# Patient Record
Sex: Male | Born: 1965 | Race: White | Hispanic: No | Marital: Married | Smoking: Former smoker
Health system: Southern US, Community
[De-identification: ages and names within clinical notes are randomized; demographics above are authoritative.]

## PROBLEM LIST (undated history)

## (undated) DIAGNOSIS — H3561 Retinal hemorrhage, right eye: Secondary | ICD-10-CM

## (undated) DIAGNOSIS — Z87442 Personal history of urinary calculi: Secondary | ICD-10-CM

## (undated) DIAGNOSIS — I493 Ventricular premature depolarization: Secondary | ICD-10-CM

## (undated) DIAGNOSIS — E785 Hyperlipidemia, unspecified: Secondary | ICD-10-CM

## (undated) DIAGNOSIS — E119 Type 2 diabetes mellitus without complications: Secondary | ICD-10-CM

## (undated) DIAGNOSIS — M479 Spondylosis, unspecified: Secondary | ICD-10-CM

## (undated) DIAGNOSIS — L089 Local infection of the skin and subcutaneous tissue, unspecified: Secondary | ICD-10-CM

## (undated) DIAGNOSIS — I10 Essential (primary) hypertension: Secondary | ICD-10-CM

## (undated) DIAGNOSIS — G4733 Obstructive sleep apnea (adult) (pediatric): Secondary | ICD-10-CM

## (undated) DIAGNOSIS — Z9989 Dependence on other enabling machines and devices: Secondary | ICD-10-CM

## (undated) DIAGNOSIS — G629 Polyneuropathy, unspecified: Secondary | ICD-10-CM

## (undated) HISTORY — DX: Ventricular premature depolarization: I49.3

## (undated) HISTORY — PX: TONSILLECTOMY AND ADENOIDECTOMY: SUR1326

## (undated) HISTORY — DX: Type 2 diabetes mellitus without complications: E11.9

## (undated) HISTORY — DX: Obstructive sleep apnea (adult) (pediatric): G47.33

## (undated) HISTORY — DX: Hyperlipidemia, unspecified: E78.5

## (undated) HISTORY — DX: Essential (primary) hypertension: I10

## (undated) HISTORY — DX: Spondylosis, unspecified: M47.9

## (undated) HISTORY — DX: Local infection of the skin and subcutaneous tissue, unspecified: L08.9

## (undated) HISTORY — DX: Obstructive sleep apnea (adult) (pediatric): Z99.89

## (undated) HISTORY — DX: Polyneuropathy, unspecified: G62.9

## (undated) HISTORY — DX: Retinal hemorrhage, right eye: H35.61

## (undated) HISTORY — PX: OTHER SURGICAL HISTORY: SHX169

---

## 1998-04-01 ENCOUNTER — Ambulatory Visit: Admission: RE | Admit: 1998-04-01 | Discharge: 1998-04-01 | Payer: Self-pay | Admitting: Family Medicine

## 2003-05-02 ENCOUNTER — Ambulatory Visit (HOSPITAL_COMMUNITY): Admission: RE | Admit: 2003-05-02 | Discharge: 2003-05-02 | Payer: Self-pay | Admitting: Family Medicine

## 2003-05-02 ENCOUNTER — Encounter: Payer: Self-pay | Admitting: Family Medicine

## 2003-05-04 ENCOUNTER — Ambulatory Visit (HOSPITAL_COMMUNITY): Admission: RE | Admit: 2003-05-04 | Discharge: 2003-05-04 | Payer: Self-pay | Admitting: Family Medicine

## 2003-05-04 ENCOUNTER — Encounter: Payer: Self-pay | Admitting: Family Medicine

## 2003-08-04 ENCOUNTER — Ambulatory Visit (HOSPITAL_COMMUNITY): Admission: RE | Admit: 2003-08-04 | Discharge: 2003-08-04 | Payer: Self-pay | Admitting: Family Medicine

## 2003-08-04 ENCOUNTER — Encounter: Payer: Self-pay | Admitting: Family Medicine

## 2004-02-15 ENCOUNTER — Ambulatory Visit (HOSPITAL_COMMUNITY): Admission: RE | Admit: 2004-02-15 | Discharge: 2004-02-15 | Payer: Self-pay | Admitting: Family Medicine

## 2004-07-16 ENCOUNTER — Ambulatory Visit: Payer: Self-pay | Admitting: Internal Medicine

## 2004-07-23 ENCOUNTER — Ambulatory Visit: Payer: Self-pay | Admitting: Family Medicine

## 2004-07-23 ENCOUNTER — Ambulatory Visit: Payer: Self-pay | Admitting: *Deleted

## 2004-07-26 ENCOUNTER — Ambulatory Visit: Payer: Self-pay | Admitting: Family Medicine

## 2004-09-25 ENCOUNTER — Ambulatory Visit: Payer: Self-pay | Admitting: Family Medicine

## 2004-10-24 ENCOUNTER — Ambulatory Visit: Payer: Self-pay | Admitting: Family Medicine

## 2004-10-30 ENCOUNTER — Ambulatory Visit: Payer: Self-pay | Admitting: Family Medicine

## 2004-10-31 ENCOUNTER — Ambulatory Visit (HOSPITAL_COMMUNITY): Admission: RE | Admit: 2004-10-31 | Discharge: 2004-10-31 | Payer: Self-pay | Admitting: Family Medicine

## 2004-11-06 ENCOUNTER — Ambulatory Visit: Payer: Self-pay | Admitting: Family Medicine

## 2004-11-20 ENCOUNTER — Ambulatory Visit: Payer: Self-pay | Admitting: Gastroenterology

## 2004-12-03 ENCOUNTER — Ambulatory Visit: Payer: Self-pay | Admitting: Gastroenterology

## 2004-12-03 ENCOUNTER — Ambulatory Visit (HOSPITAL_COMMUNITY): Admission: RE | Admit: 2004-12-03 | Discharge: 2004-12-03 | Payer: Self-pay | Admitting: Gastroenterology

## 2005-01-01 ENCOUNTER — Ambulatory Visit: Payer: Self-pay | Admitting: Family Medicine

## 2005-01-03 ENCOUNTER — Ambulatory Visit (HOSPITAL_COMMUNITY): Admission: RE | Admit: 2005-01-03 | Discharge: 2005-01-03 | Payer: Self-pay | Admitting: Family Medicine

## 2005-02-08 ENCOUNTER — Ambulatory Visit: Payer: Self-pay | Admitting: Family Medicine

## 2005-03-29 ENCOUNTER — Ambulatory Visit: Payer: Self-pay | Admitting: Family Medicine

## 2005-08-09 ENCOUNTER — Ambulatory Visit: Payer: Self-pay | Admitting: Family Medicine

## 2005-08-16 ENCOUNTER — Ambulatory Visit (HOSPITAL_COMMUNITY): Admission: RE | Admit: 2005-08-16 | Discharge: 2005-08-16 | Payer: Self-pay | Admitting: Family Medicine

## 2005-11-12 ENCOUNTER — Ambulatory Visit: Payer: Self-pay | Admitting: Family Medicine

## 2006-01-07 ENCOUNTER — Ambulatory Visit: Payer: Self-pay | Admitting: Family Medicine

## 2006-02-11 ENCOUNTER — Ambulatory Visit: Payer: Self-pay | Admitting: Family Medicine

## 2006-03-05 ENCOUNTER — Ambulatory Visit: Payer: Self-pay | Admitting: Family Medicine

## 2006-03-10 ENCOUNTER — Ambulatory Visit (HOSPITAL_COMMUNITY): Admission: RE | Admit: 2006-03-10 | Discharge: 2006-03-10 | Payer: Self-pay | Admitting: Family Medicine

## 2006-06-04 ENCOUNTER — Ambulatory Visit: Payer: Self-pay | Admitting: Family Medicine

## 2006-07-22 ENCOUNTER — Ambulatory Visit (HOSPITAL_BASED_OUTPATIENT_CLINIC_OR_DEPARTMENT_OTHER): Admission: RE | Admit: 2006-07-22 | Discharge: 2006-07-22 | Payer: Self-pay | Admitting: Family Medicine

## 2006-07-27 ENCOUNTER — Ambulatory Visit: Payer: Self-pay | Admitting: Internal Medicine

## 2006-10-31 ENCOUNTER — Ambulatory Visit: Payer: Self-pay | Admitting: Family Medicine

## 2006-11-26 ENCOUNTER — Ambulatory Visit (HOSPITAL_COMMUNITY): Admission: RE | Admit: 2006-11-26 | Discharge: 2006-11-26 | Payer: Self-pay | Admitting: Family Medicine

## 2006-11-26 ENCOUNTER — Ambulatory Visit: Payer: Self-pay | Admitting: Family Medicine

## 2006-12-22 ENCOUNTER — Ambulatory Visit: Payer: Self-pay | Admitting: Family Medicine

## 2006-12-25 ENCOUNTER — Ambulatory Visit (HOSPITAL_COMMUNITY): Admission: RE | Admit: 2006-12-25 | Discharge: 2006-12-25 | Payer: Self-pay | Admitting: Family Medicine

## 2007-01-05 ENCOUNTER — Ambulatory Visit: Payer: Self-pay | Admitting: Family Medicine

## 2007-01-08 ENCOUNTER — Emergency Department (HOSPITAL_COMMUNITY): Admission: EM | Admit: 2007-01-08 | Discharge: 2007-01-09 | Payer: Self-pay | Admitting: Emergency Medicine

## 2007-02-10 ENCOUNTER — Ambulatory Visit: Payer: Self-pay | Admitting: Family Medicine

## 2007-02-16 ENCOUNTER — Ambulatory Visit (HOSPITAL_COMMUNITY): Admission: RE | Admit: 2007-02-16 | Discharge: 2007-02-16 | Payer: Self-pay | Admitting: Family Medicine

## 2007-04-23 ENCOUNTER — Encounter: Admission: RE | Admit: 2007-04-23 | Discharge: 2007-06-19 | Payer: Self-pay | Admitting: Orthopaedic Surgery

## 2007-05-13 ENCOUNTER — Ambulatory Visit: Payer: Self-pay | Admitting: Family Medicine

## 2007-05-14 ENCOUNTER — Encounter (INDEPENDENT_AMBULATORY_CARE_PROVIDER_SITE_OTHER): Payer: Self-pay | Admitting: Family Medicine

## 2007-05-14 LAB — CONVERTED CEMR LAB: Microalbumin U total vol: 23.8 mg/L

## 2007-05-19 ENCOUNTER — Ambulatory Visit (HOSPITAL_COMMUNITY): Admission: RE | Admit: 2007-05-19 | Discharge: 2007-05-19 | Payer: Self-pay | Admitting: Family Medicine

## 2007-09-08 ENCOUNTER — Ambulatory Visit: Payer: Self-pay | Admitting: Family Medicine

## 2007-09-21 ENCOUNTER — Ambulatory Visit: Payer: Self-pay | Admitting: Family Medicine

## 2007-09-28 ENCOUNTER — Encounter (INDEPENDENT_AMBULATORY_CARE_PROVIDER_SITE_OTHER): Payer: Self-pay | Admitting: Family Medicine

## 2007-09-28 DIAGNOSIS — M545 Low back pain, unspecified: Secondary | ICD-10-CM | POA: Insufficient documentation

## 2007-09-28 DIAGNOSIS — F17200 Nicotine dependence, unspecified, uncomplicated: Secondary | ICD-10-CM | POA: Insufficient documentation

## 2007-09-28 DIAGNOSIS — E785 Hyperlipidemia, unspecified: Secondary | ICD-10-CM | POA: Insufficient documentation

## 2007-09-28 DIAGNOSIS — G4733 Obstructive sleep apnea (adult) (pediatric): Secondary | ICD-10-CM | POA: Insufficient documentation

## 2007-09-28 DIAGNOSIS — M1990 Unspecified osteoarthritis, unspecified site: Secondary | ICD-10-CM | POA: Insufficient documentation

## 2007-09-28 DIAGNOSIS — E1149 Type 2 diabetes mellitus with other diabetic neurological complication: Secondary | ICD-10-CM | POA: Insufficient documentation

## 2007-09-28 DIAGNOSIS — H269 Unspecified cataract: Secondary | ICD-10-CM | POA: Insufficient documentation

## 2007-09-28 DIAGNOSIS — M79609 Pain in unspecified limb: Secondary | ICD-10-CM | POA: Insufficient documentation

## 2007-09-28 DIAGNOSIS — I10 Essential (primary) hypertension: Secondary | ICD-10-CM | POA: Insufficient documentation

## 2007-09-28 DIAGNOSIS — E669 Obesity, unspecified: Secondary | ICD-10-CM | POA: Insufficient documentation

## 2007-09-29 DIAGNOSIS — IMO0001 Reserved for inherently not codable concepts without codable children: Secondary | ICD-10-CM | POA: Insufficient documentation

## 2007-09-29 DIAGNOSIS — E1165 Type 2 diabetes mellitus with hyperglycemia: Secondary | ICD-10-CM

## 2007-10-19 ENCOUNTER — Ambulatory Visit: Payer: Self-pay | Admitting: Internal Medicine

## 2007-10-20 ENCOUNTER — Ambulatory Visit (HOSPITAL_COMMUNITY): Admission: RE | Admit: 2007-10-20 | Discharge: 2007-10-20 | Payer: Self-pay | Admitting: Internal Medicine

## 2007-12-22 ENCOUNTER — Ambulatory Visit: Payer: Self-pay | Admitting: Family Medicine

## 2008-03-08 ENCOUNTER — Ambulatory Visit: Payer: Self-pay | Admitting: Family Medicine

## 2008-03-08 LAB — CONVERTED CEMR LAB
ALT: 56 units/L — ABNORMAL HIGH (ref 0–53)
AST: 41 units/L — ABNORMAL HIGH (ref 0–37)
Albumin: 4.3 g/dL (ref 3.5–5.2)
Alkaline Phosphatase: 101 units/L (ref 39–117)
BUN: 15 mg/dL (ref 6–23)
CO2: 26 meq/L (ref 19–32)
Calcium: 8.9 mg/dL (ref 8.4–10.5)
Chloride: 97 meq/L (ref 96–112)
Cholesterol: 227 mg/dL — ABNORMAL HIGH (ref 0–200)
Creatinine, Ser: 0.71 mg/dL (ref 0.40–1.50)
Glucose, Bld: 324 mg/dL — ABNORMAL HIGH (ref 70–99)
HDL: 36 mg/dL — ABNORMAL LOW (ref >39)
Potassium: 4.5 meq/L (ref 3.5–5.3)
Sodium: 136 meq/L (ref 135–145)
Total Bilirubin: 1.8 mg/dL — ABNORMAL HIGH (ref 0.3–1.2)
Total CHOL/HDL Ratio: 6.3
Total Protein: 7.8 g/dL (ref 6.0–8.3)
Triglycerides: 741 mg/dL — ABNORMAL HIGH (ref <150)

## 2008-04-28 ENCOUNTER — Ambulatory Visit: Payer: Self-pay | Admitting: Family Medicine

## 2008-04-28 LAB — CONVERTED CEMR LAB
ALT: 45 units/L (ref 0–53)
AST: 32 units/L (ref 0–37)
Albumin: 4.4 g/dL (ref 3.5–5.2)
Alkaline Phosphatase: 98 units/L (ref 39–117)
Bilirubin, Direct: 0.2 mg/dL (ref 0.0–0.3)
Cholesterol: 163 mg/dL (ref 0–200)
HDL: 30 mg/dL — ABNORMAL LOW (ref >39)
Indirect Bilirubin: 1.4 mg/dL — ABNORMAL HIGH (ref 0.0–0.9)
Total Bilirubin: 1.6 mg/dL — ABNORMAL HIGH (ref 0.3–1.2)
Total CHOL/HDL Ratio: 5.4
Total Protein: 7.1 g/dL (ref 6.0–8.3)
Triglycerides: 464 mg/dL — ABNORMAL HIGH (ref <150)

## 2008-06-04 IMAGING — CR DG FEET 3 VIEWS BILAT
6 series · 6 of 6 positions shown · non-contrast
Comparison: None

CLINICAL DATA: 40-year-old with left foot pain.  
 LEFT FOOT - 3 VIEW:

[t foot ap left]
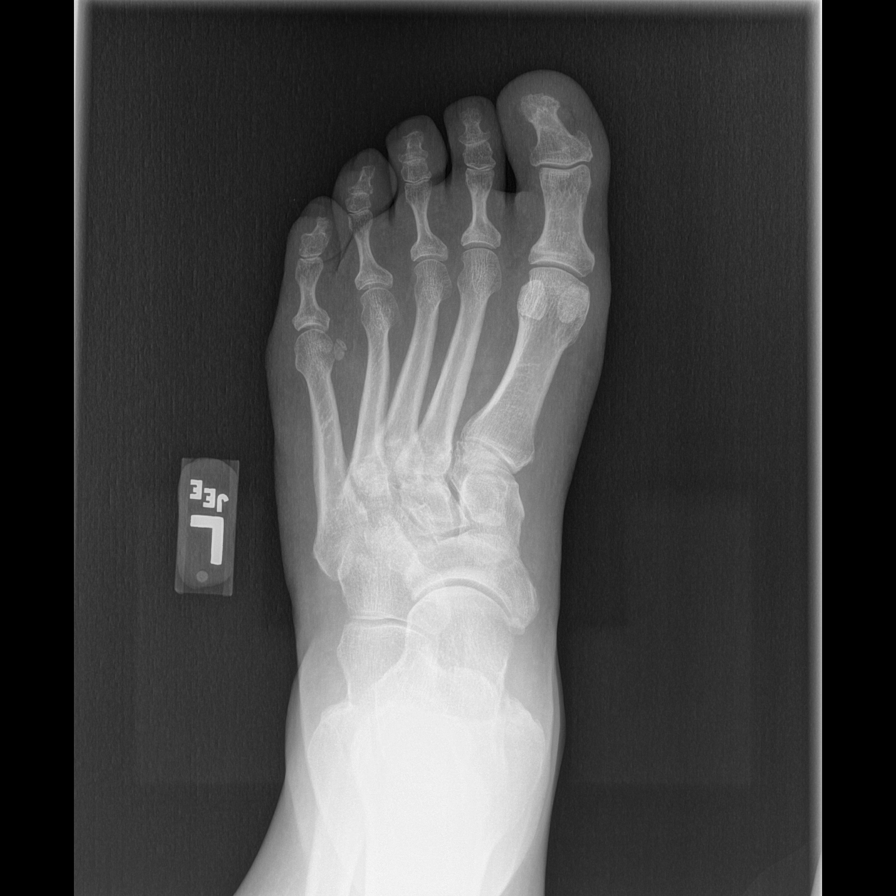

[t foot oblique left *]
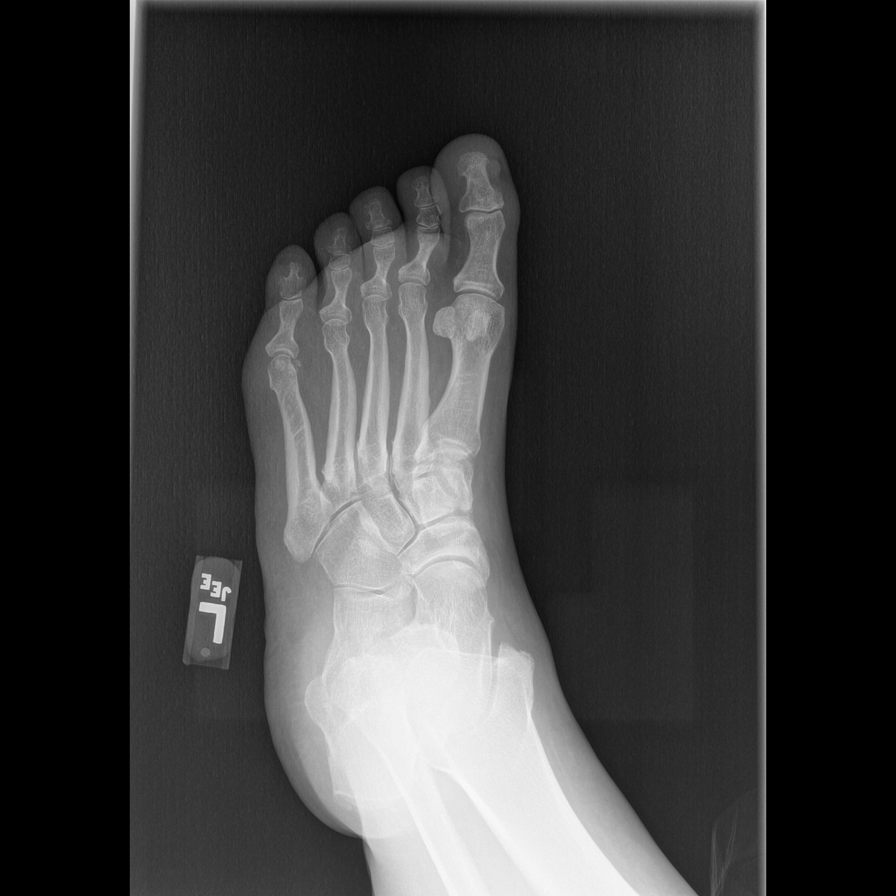

[t foot lat left]
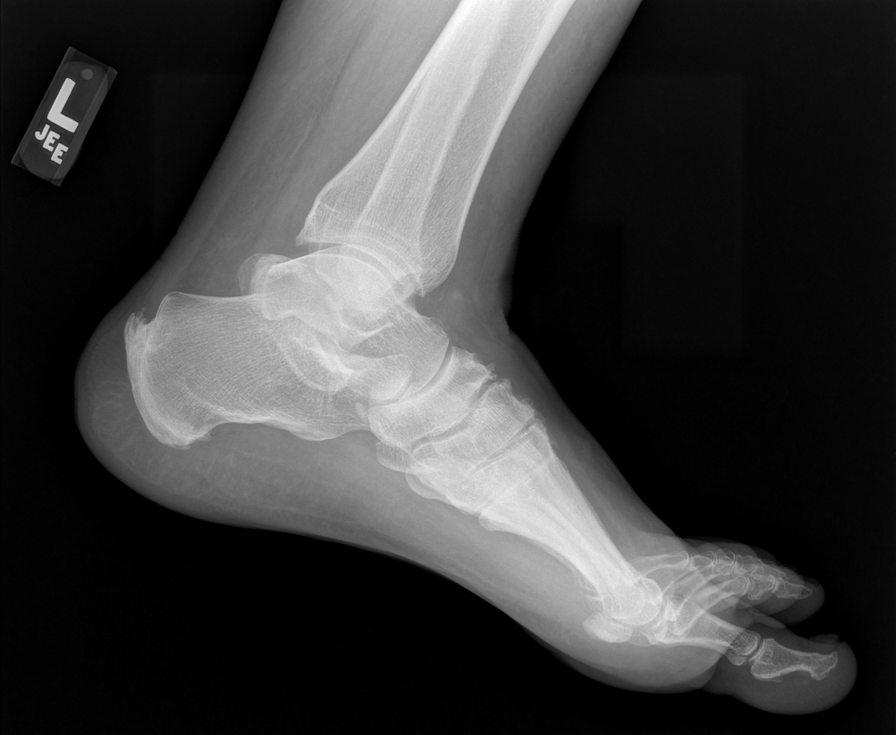

[t foot ap right]
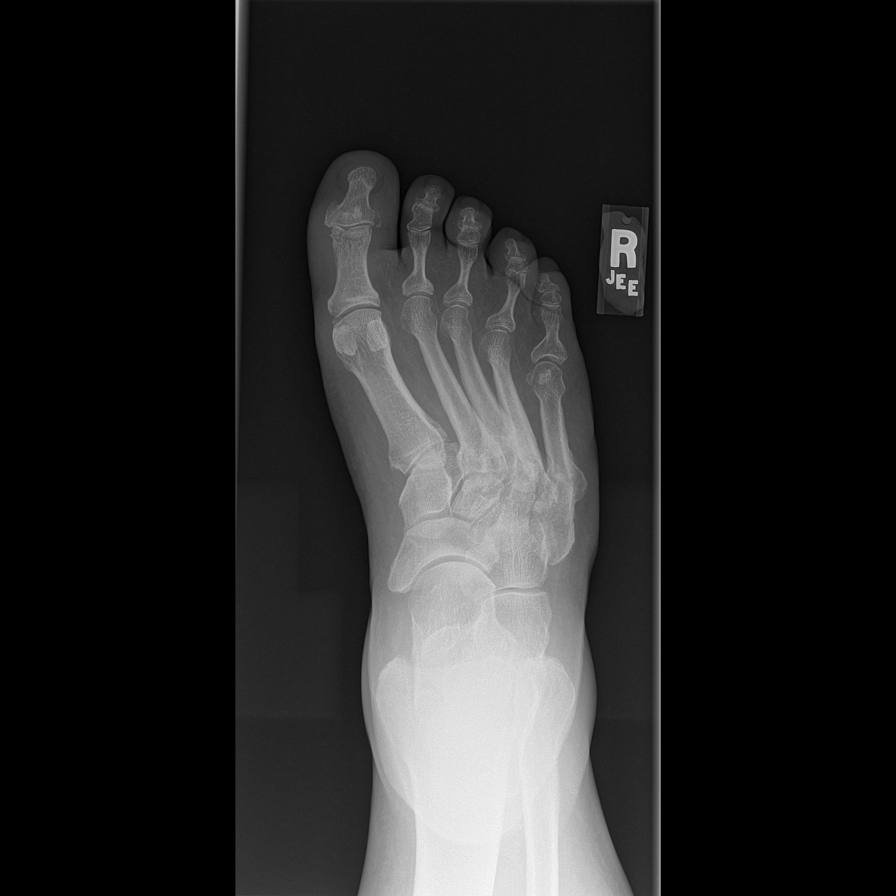

[t foot oblique right]
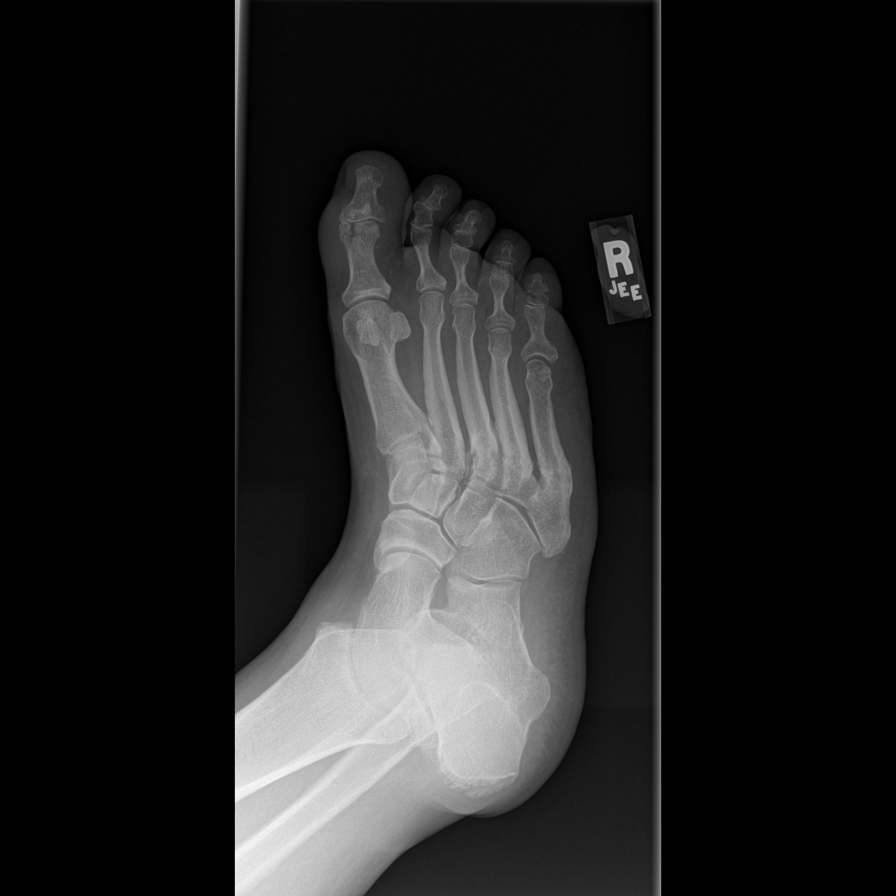

[t foot lat right]
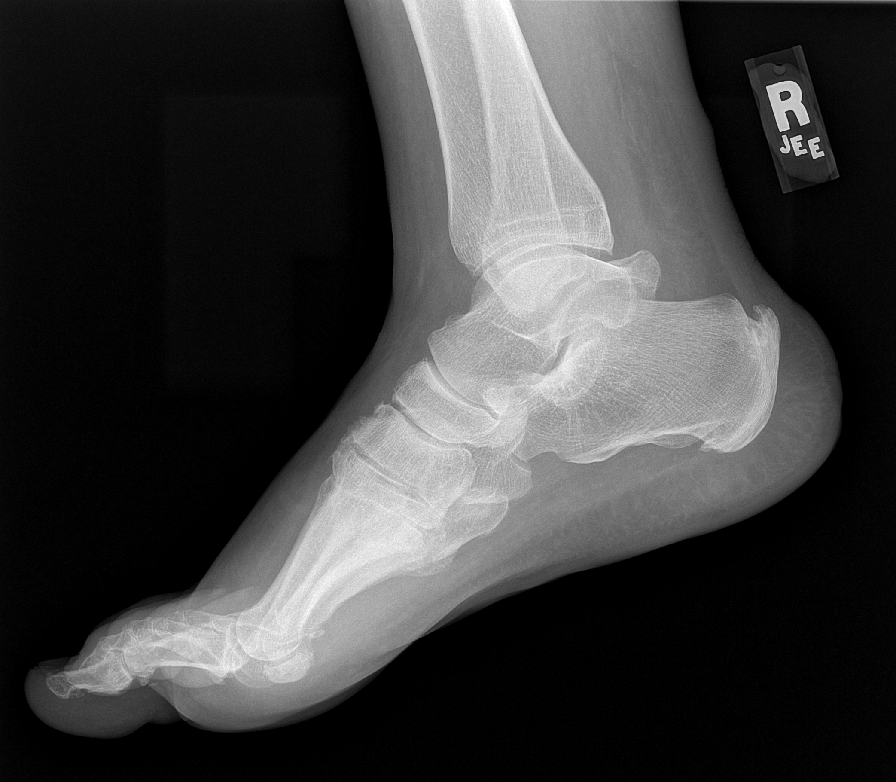

[6 of 6 positions shown; findings below may reference images not displayed]

FINDINGS: Joint spaces are maintained.  Mild midfoot degenerative changes.  No acute bony findings or destructive bony lesions.  Small calcaneal heel spurs are noted.
IMPRESSION: Mid-foot degenerative changes.  No acute bony findings. 
 RIGHT FOOT - 3 VIEW:
FINDINGS: There are midfoot degenerative changes.  There are fractures involving the proximal and distal phalanges of the great toe with an intraarticular component involving the proximal phalanx and slight depression.  Remaining bony structures are intact.  There is a bony exostosis along the lateral aspect of the 5th metatarsal.
IMPRESSION: 1.  Midfoot degenerative changes. 
 2.  Proximal and distal phalanx fractures of the great toe with intraarticular component.  
 3.  Bony exostosis off the lateral margin of the 5th metatarsal.

## 2008-08-08 ENCOUNTER — Ambulatory Visit: Payer: Self-pay | Admitting: Internal Medicine

## 2008-08-10 ENCOUNTER — Encounter: Admission: RE | Admit: 2008-08-10 | Discharge: 2008-08-10 | Payer: Self-pay | Admitting: Orthopaedic Surgery

## 2008-12-19 ENCOUNTER — Ambulatory Visit: Payer: Self-pay | Admitting: Internal Medicine

## 2008-12-19 LAB — CONVERTED CEMR LAB
ALT: 43 units/L (ref 0–53)
Cholesterol: 262 mg/dL — ABNORMAL HIGH (ref 0–200)
HDL: 26 mg/dL — ABNORMAL LOW (ref >39)
Microalb, Ur: 56.25 mg/dL — ABNORMAL HIGH (ref 0.00–1.89)
Total CHOL/HDL Ratio: 10.1
Triglycerides: 1927 mg/dL — ABNORMAL HIGH (ref <150)

## 2009-01-12 ENCOUNTER — Ambulatory Visit: Payer: Self-pay | Admitting: Family Medicine

## 2009-03-13 ENCOUNTER — Ambulatory Visit: Payer: Self-pay | Admitting: Family Medicine

## 2009-03-13 LAB — CONVERTED CEMR LAB
ALT: 36 units/L (ref 0–53)
Cholesterol: 168 mg/dL (ref 0–200)
HDL: 32 mg/dL — ABNORMAL LOW (ref >39)
Total CHOL/HDL Ratio: 5.3
Triglycerides: 487 mg/dL — ABNORMAL HIGH (ref <150)

## 2009-04-14 ENCOUNTER — Ambulatory Visit: Payer: Self-pay | Admitting: Family Medicine

## 2009-04-14 LAB — CONVERTED CEMR LAB: Microalb, Ur: 34.14 mg/dL — ABNORMAL HIGH (ref 0.00–1.89)

## 2009-04-27 ENCOUNTER — Ambulatory Visit: Payer: Self-pay | Admitting: Family Medicine

## 2009-08-31 ENCOUNTER — Emergency Department (HOSPITAL_COMMUNITY): Admission: EM | Admit: 2009-08-31 | Discharge: 2009-08-31 | Payer: Self-pay | Admitting: Emergency Medicine

## 2010-11-24 ENCOUNTER — Encounter: Payer: Self-pay | Admitting: Family Medicine

## 2011-02-14 ENCOUNTER — Other Ambulatory Visit (HOSPITAL_COMMUNITY): Payer: Self-pay | Admitting: Family Medicine

## 2011-02-14 ENCOUNTER — Ambulatory Visit (HOSPITAL_COMMUNITY)
Admission: RE | Admit: 2011-02-14 | Discharge: 2011-02-14 | Disposition: A | Discharge status: Home / Self Care | Payer: Medicaid Other | Source: Ambulatory Visit | Attending: Family Medicine | Admitting: Family Medicine

## 2011-02-14 DIAGNOSIS — M169 Osteoarthritis of hip, unspecified: Secondary | ICD-10-CM | POA: Insufficient documentation

## 2011-02-14 DIAGNOSIS — M1610 Unilateral primary osteoarthritis, unspecified hip: Secondary | ICD-10-CM | POA: Insufficient documentation

## 2011-02-14 DIAGNOSIS — R52 Pain, unspecified: Secondary | ICD-10-CM

## 2011-02-14 DIAGNOSIS — M25559 Pain in unspecified hip: Secondary | ICD-10-CM | POA: Insufficient documentation

## 2011-03-22 NOTE — Procedures (Signed)
NAMEJANET, Joseph Hinton NO.:  [id number]   MEDICAL RECORD NO.:  [id number]          PATIENT TYPE:  OUT   LOCATION:  SLEEP CENTER                 FACILITY:  [facility]   PHYSICIAN:  Clinton D. Maple Hudson, MD, FCCP, FACPDATE OF BIRTH:  1965-01-13   DATE OF STUDY:  07/22/2006                              NOCTURNAL POLYSOMNOGRAM   REFERRING PHYSICIAN:  Dr. Dow Adolph   INDICATIONS FOR STUDY:  Hypersomnia with sleep apnea.   EPWORTH SLEEPINESS SCORE:  17/24.   BMI:  45.7.   HOME MEDICATIONS:  Are listed and reviewed, significant now for a  Gabapentin, amitriptyline, Cymbalta, cyclobenzaprine.   The patient has a history of obstructive sleep apnea with previous  diagnostic study in the 1990s.  He has been using CPAP of 13 CWP at home but  complains of interrupted breathing witnessed struggling in sleep and daytime  tiredness.   SLEEP ARCHITECTURE:  Total sleep time 365 minutes with sleep efficiency 78%.  Stage I was 16%, stage II 84%, stages III and IV and REM were absent.  Sleep  latency 55 minutes, awake after sleep onset 51 minutes, arousal index  markedly increased at 63.9 indicating increased sleep fragmentation  especially in the first half of the night before CPAP.  No bedtime  medication was taken.   RESPIRATORY RESPIRATORY DATA:  Split study protocol.   APNEA/HYPOPNEA INDEX:  (AHI, RDI) 102.7 obstructive events per hour  indicating severe obstructive sleep apnea/hypopnea syndrome before CPAP.  This included 123 obstructive apneas, one central apnea and 96 hypopneas.  Events were not positional.  REM,  AHI n/a.  CPAP was titrated to 18 CWP,  AHI 0 per hour.  His own large ResMed ultra mirage full-face mask was used  with heated humidifier.   OXYGEN DATA:  Moderate to severe snoring before CPAP control with oxygen  desaturation to a nadir of 70%.  After CPAP control oxygen saturation held  94% on room air.   CARDIAC DATA:  Frequent PVCs with ventricular  bigeminy and trigeminy  throughout the study.   MOVEMENTS/PARASOMNIA:  Questionable bruxism.  A total of 118 limb jerks were  recorded of which 48 were associated with arousal or awakening for periodic  limb movement with arousal index of 7.9 per hour which is increased.  This  may partly reflect sleep disturbance while not on his familiar CPAP control  and can be reevaluated clinically after CPAP is adjusted if appropriate.   IMPRESSION/RECOMMENDATIONS:  1. Severe obstructive sleep apnea/hypopnea syndrome, a AHI 102.7 per hour      with non positional events moderate to loud snoring and oxygen      desaturation to 70%.  2. Successful CPAP titration to 18 CWP, a AHI 0 per hour.  His own large      ResMed ultra mirage full-face mask was used with heated humidifier.  3. Periodic limb movement with arousal, 7.9 per hour.  Suggest this be      reconsidered clinically once he is established again at home with CPAP      because these scores may simply reflect the CPAP adjustment during the  study.  4. Frequent PVCs including ventricular bigeminy and ventricular trigeminy.      Clinton D. Maple Hudson, MD, [facility], FACP  Diplomate, Biomedical engineer of Sleep Medicine  Electronically Signed     CDY/MEDQ  D:  07/27/2006 11:16:39  T:  07/29/2006 12:56:45  Job:  [id number]

## 2012-04-06 ENCOUNTER — Other Ambulatory Visit: Payer: Self-pay | Admitting: Family Medicine

## 2012-11-20 ENCOUNTER — Ambulatory Visit: Payer: Self-pay | Admitting: Family Medicine

## 2013-06-06 ENCOUNTER — Other Ambulatory Visit: Payer: Self-pay | Admitting: Physician Assistant

## 2013-06-11 ENCOUNTER — Other Ambulatory Visit: Payer: Self-pay | Admitting: Physician Assistant

## 2013-06-11 NOTE — Telephone Encounter (Signed)
Not seen here in [facility].  Needs OV/Labs for refills. Appears to be a patient of Dr. Angelyn Punt when she was at [facility].

## 2015-08-07 DIAGNOSIS — L97319 Non-pressure chronic ulcer of right ankle with unspecified severity: Secondary | ICD-10-CM | POA: Diagnosis not present

## 2015-08-07 DIAGNOSIS — L97529 Non-pressure chronic ulcer of other part of left foot with unspecified severity: Secondary | ICD-10-CM | POA: Diagnosis not present

## 2015-08-14 DIAGNOSIS — L97319 Non-pressure chronic ulcer of right ankle with unspecified severity: Secondary | ICD-10-CM | POA: Diagnosis not present

## 2015-08-14 DIAGNOSIS — L97529 Non-pressure chronic ulcer of other part of left foot with unspecified severity: Secondary | ICD-10-CM | POA: Diagnosis not present

## 2015-09-11 DIAGNOSIS — I10 Essential (primary) hypertension: Secondary | ICD-10-CM | POA: Diagnosis not present

## 2015-09-11 DIAGNOSIS — E119 Type 2 diabetes mellitus without complications: Secondary | ICD-10-CM | POA: Diagnosis not present

## 2015-09-11 DIAGNOSIS — Z0000 Encounter for general adult medical examination without abnormal findings: Secondary | ICD-10-CM | POA: Diagnosis not present

## 2015-09-11 DIAGNOSIS — R69 Illness, unspecified: Secondary | ICD-10-CM | POA: Diagnosis not present

## 2015-09-19 DIAGNOSIS — E781 Pure hyperglyceridemia: Secondary | ICD-10-CM | POA: Diagnosis not present

## 2015-09-19 DIAGNOSIS — I10 Essential (primary) hypertension: Secondary | ICD-10-CM | POA: Diagnosis not present

## 2015-09-19 DIAGNOSIS — N390 Urinary tract infection, site not specified: Secondary | ICD-10-CM | POA: Diagnosis not present

## 2015-09-19 DIAGNOSIS — Z6841 Body Mass Index (BMI) 40.0 and over, adult: Secondary | ICD-10-CM | POA: Diagnosis not present

## 2015-09-19 DIAGNOSIS — Z87891 Personal history of nicotine dependence: Secondary | ICD-10-CM | POA: Diagnosis not present

## 2015-09-19 DIAGNOSIS — E784 Other hyperlipidemia: Secondary | ICD-10-CM | POA: Diagnosis not present

## 2015-09-19 DIAGNOSIS — G4733 Obstructive sleep apnea (adult) (pediatric): Secondary | ICD-10-CM | POA: Diagnosis not present

## 2015-09-19 DIAGNOSIS — M1990 Unspecified osteoarthritis, unspecified site: Secondary | ICD-10-CM | POA: Diagnosis not present

## 2015-09-19 DIAGNOSIS — E119 Type 2 diabetes mellitus without complications: Secondary | ICD-10-CM | POA: Diagnosis not present

## 2015-09-19 DIAGNOSIS — E669 Obesity, unspecified: Secondary | ICD-10-CM | POA: Diagnosis not present

## 2015-10-11 DIAGNOSIS — R69 Illness, unspecified: Secondary | ICD-10-CM | POA: Diagnosis not present

## 2015-10-24 DIAGNOSIS — R69 Illness, unspecified: Secondary | ICD-10-CM | POA: Diagnosis not present

## 2015-12-09 DIAGNOSIS — R69 Illness, unspecified: Secondary | ICD-10-CM | POA: Diagnosis not present

## 2015-12-19 DIAGNOSIS — R109 Unspecified abdominal pain: Secondary | ICD-10-CM | POA: Diagnosis not present

## 2015-12-19 DIAGNOSIS — Z6841 Body Mass Index (BMI) 40.0 and over, adult: Secondary | ICD-10-CM | POA: Diagnosis not present

## 2015-12-19 DIAGNOSIS — K5900 Constipation, unspecified: Secondary | ICD-10-CM | POA: Diagnosis not present

## 2015-12-19 DIAGNOSIS — J019 Acute sinusitis, unspecified: Secondary | ICD-10-CM | POA: Diagnosis not present

## 2015-12-19 DIAGNOSIS — I10 Essential (primary) hypertension: Secondary | ICD-10-CM | POA: Diagnosis not present

## 2015-12-19 DIAGNOSIS — E119 Type 2 diabetes mellitus without complications: Secondary | ICD-10-CM | POA: Diagnosis not present

## 2016-01-29 DIAGNOSIS — R69 Illness, unspecified: Secondary | ICD-10-CM | POA: Diagnosis not present

## 2016-03-20 DIAGNOSIS — H3582 Retinal ischemia: Secondary | ICD-10-CM | POA: Diagnosis not present

## 2016-03-20 DIAGNOSIS — E113593 Type 2 diabetes mellitus with proliferative diabetic retinopathy without macular edema, bilateral: Secondary | ICD-10-CM | POA: Diagnosis not present

## 2016-03-25 DIAGNOSIS — E781 Pure hyperglyceridemia: Secondary | ICD-10-CM | POA: Diagnosis not present

## 2016-03-25 DIAGNOSIS — E784 Other hyperlipidemia: Secondary | ICD-10-CM | POA: Diagnosis not present

## 2016-03-25 DIAGNOSIS — E119 Type 2 diabetes mellitus without complications: Secondary | ICD-10-CM | POA: Diagnosis not present

## 2016-03-25 DIAGNOSIS — Z6841 Body Mass Index (BMI) 40.0 and over, adult: Secondary | ICD-10-CM | POA: Diagnosis not present

## 2016-03-25 DIAGNOSIS — I10 Essential (primary) hypertension: Secondary | ICD-10-CM | POA: Diagnosis not present

## 2016-03-25 DIAGNOSIS — G4733 Obstructive sleep apnea (adult) (pediatric): Secondary | ICD-10-CM | POA: Diagnosis not present

## 2016-03-25 DIAGNOSIS — M1990 Unspecified osteoarthritis, unspecified site: Secondary | ICD-10-CM | POA: Diagnosis not present

## 2016-03-25 DIAGNOSIS — E1140 Type 2 diabetes mellitus with diabetic neuropathy, unspecified: Secondary | ICD-10-CM | POA: Diagnosis not present

## 2016-03-25 DIAGNOSIS — E11319 Type 2 diabetes mellitus with unspecified diabetic retinopathy without macular edema: Secondary | ICD-10-CM | POA: Diagnosis not present

## 2016-03-25 DIAGNOSIS — E668 Other obesity: Secondary | ICD-10-CM | POA: Diagnosis not present

## 2016-06-07 DIAGNOSIS — Z0100 Encounter for examination of eyes and vision without abnormal findings: Secondary | ICD-10-CM | POA: Diagnosis not present

## 2016-06-07 DIAGNOSIS — H52223 Regular astigmatism, bilateral: Secondary | ICD-10-CM | POA: Diagnosis not present

## 2016-06-12 DIAGNOSIS — H5213 Myopia, bilateral: Secondary | ICD-10-CM | POA: Diagnosis not present

## 2016-06-12 DIAGNOSIS — H52209 Unspecified astigmatism, unspecified eye: Secondary | ICD-10-CM | POA: Diagnosis not present

## 2016-06-12 DIAGNOSIS — H524 Presbyopia: Secondary | ICD-10-CM | POA: Diagnosis not present

## 2016-06-12 DIAGNOSIS — Z0100 Encounter for examination of eyes and vision without abnormal findings: Secondary | ICD-10-CM | POA: Diagnosis not present

## 2016-07-10 DIAGNOSIS — M5136 Other intervertebral disc degeneration, lumbar region: Secondary | ICD-10-CM | POA: Diagnosis not present

## 2016-07-10 DIAGNOSIS — M25552 Pain in left hip: Secondary | ICD-10-CM | POA: Diagnosis not present

## 2016-07-10 DIAGNOSIS — I10 Essential (primary) hypertension: Secondary | ICD-10-CM | POA: Diagnosis not present

## 2016-07-10 DIAGNOSIS — E784 Other hyperlipidemia: Secondary | ICD-10-CM | POA: Diagnosis not present

## 2016-07-10 DIAGNOSIS — Z1389 Encounter for screening for other disorder: Secondary | ICD-10-CM | POA: Diagnosis not present

## 2016-07-10 DIAGNOSIS — Z6841 Body Mass Index (BMI) 40.0 and over, adult: Secondary | ICD-10-CM | POA: Diagnosis not present

## 2016-07-10 DIAGNOSIS — E119 Type 2 diabetes mellitus without complications: Secondary | ICD-10-CM | POA: Diagnosis not present

## 2016-08-06 DIAGNOSIS — L97513 Non-pressure chronic ulcer of other part of right foot with necrosis of muscle: Secondary | ICD-10-CM | POA: Diagnosis not present

## 2016-08-06 DIAGNOSIS — L03119 Cellulitis of unspecified part of limb: Secondary | ICD-10-CM | POA: Diagnosis not present

## 2016-08-13 DIAGNOSIS — L97513 Non-pressure chronic ulcer of other part of right foot with necrosis of muscle: Secondary | ICD-10-CM | POA: Diagnosis not present

## 2016-08-27 DIAGNOSIS — L97512 Non-pressure chronic ulcer of other part of right foot with fat layer exposed: Secondary | ICD-10-CM | POA: Diagnosis not present

## 2016-08-28 DIAGNOSIS — R69 Illness, unspecified: Secondary | ICD-10-CM | POA: Diagnosis not present

## 2016-09-10 DIAGNOSIS — L97512 Non-pressure chronic ulcer of other part of right foot with fat layer exposed: Secondary | ICD-10-CM | POA: Diagnosis not present

## 2016-09-11 DIAGNOSIS — R69 Illness, unspecified: Secondary | ICD-10-CM | POA: Diagnosis not present

## 2016-09-17 ENCOUNTER — Telehealth (INDEPENDENT_AMBULATORY_CARE_PROVIDER_SITE_OTHER): Payer: Self-pay | Admitting: Orthopaedic Surgery

## 2016-09-17 ENCOUNTER — Telehealth (INDEPENDENT_AMBULATORY_CARE_PROVIDER_SITE_OTHER): Payer: Self-pay | Admitting: Radiology

## 2016-09-17 NOTE — Telephone Encounter (Signed)
Please advise 

## 2016-09-17 NOTE — Telephone Encounter (Signed)
All questions answered

## 2016-09-17 NOTE — Telephone Encounter (Signed)
Wife called, has questions about THA surgery, can you call her please?  Wants to know recovery time? Wants to know if there will be rehab after?  In rehab center or will he be sent home?   Wants to know how long in hospital postop? Can he travel [place] hrs back to [place] upon d/c from hospital? How many postop visits will be needed after surgery if everything goes well?   Wants to know also about OOP costs for surgery.

## 2016-09-17 NOTE — Telephone Encounter (Signed)
Patient's wife  Darel Hong(judy) called asked who would Dr Magnus IvanBlackman  recommend (Orthopedic surgeon)  in the [place] area. Darel HongJudy said [place] bigger hospital [place].   The number to contact her is [phone]

## 2016-09-18 DIAGNOSIS — E113592 Type 2 diabetes mellitus with proliferative diabetic retinopathy without macular edema, left eye: Secondary | ICD-10-CM | POA: Diagnosis not present

## 2016-09-18 DIAGNOSIS — H3582 Retinal ischemia: Secondary | ICD-10-CM | POA: Diagnosis not present

## 2016-09-18 DIAGNOSIS — Z125 Encounter for screening for malignant neoplasm of prostate: Secondary | ICD-10-CM | POA: Diagnosis not present

## 2016-09-18 DIAGNOSIS — E119 Type 2 diabetes mellitus without complications: Secondary | ICD-10-CM | POA: Diagnosis not present

## 2016-09-18 DIAGNOSIS — E113511 Type 2 diabetes mellitus with proliferative diabetic retinopathy with macular edema, right eye: Secondary | ICD-10-CM | POA: Diagnosis not present

## 2016-09-18 DIAGNOSIS — H211X2 Other vascular disorders of iris and ciliary body, left eye: Secondary | ICD-10-CM | POA: Diagnosis not present

## 2016-09-18 DIAGNOSIS — E781 Pure hyperglyceridemia: Secondary | ICD-10-CM | POA: Diagnosis not present

## 2016-09-18 NOTE — Telephone Encounter (Signed)
I unfortunately do not know any of the orthopedic surgeons in that area

## 2016-09-19 NOTE — Telephone Encounter (Signed)
He would need to have this healed before surgery.  There is actually a Tax adviserjoint surgeon in [place] who I know named Ledon SnareJack Bowling

## 2016-09-19 NOTE — Telephone Encounter (Signed)
Patient has pressure ulcer on foot, and is asking if this needs to by completely healed before he can have surgery? Also wondering if he can travel several hours home after he is discharged from surgery/hospital.

## 2016-09-20 NOTE — Telephone Encounter (Signed)
Patient aware of the below message  

## 2016-09-23 DIAGNOSIS — Z23 Encounter for immunization: Secondary | ICD-10-CM | POA: Diagnosis not present

## 2016-09-23 DIAGNOSIS — E1140 Type 2 diabetes mellitus with diabetic neuropathy, unspecified: Secondary | ICD-10-CM | POA: Diagnosis not present

## 2016-09-23 DIAGNOSIS — Z0000 Encounter for general adult medical examination without abnormal findings: Secondary | ICD-10-CM | POA: Diagnosis not present

## 2016-09-23 DIAGNOSIS — I10 Essential (primary) hypertension: Secondary | ICD-10-CM | POA: Diagnosis not present

## 2016-09-23 DIAGNOSIS — E668 Other obesity: Secondary | ICD-10-CM | POA: Diagnosis not present

## 2016-09-23 DIAGNOSIS — E119 Type 2 diabetes mellitus without complications: Secondary | ICD-10-CM | POA: Diagnosis not present

## 2016-09-23 DIAGNOSIS — Z6841 Body Mass Index (BMI) 40.0 and over, adult: Secondary | ICD-10-CM | POA: Diagnosis not present

## 2016-09-23 DIAGNOSIS — E784 Other hyperlipidemia: Secondary | ICD-10-CM | POA: Diagnosis not present

## 2016-09-23 DIAGNOSIS — G4733 Obstructive sleep apnea (adult) (pediatric): Secondary | ICD-10-CM | POA: Diagnosis not present

## 2016-09-23 DIAGNOSIS — E11621 Type 2 diabetes mellitus with foot ulcer: Secondary | ICD-10-CM | POA: Diagnosis not present

## 2016-10-08 DIAGNOSIS — L97512 Non-pressure chronic ulcer of other part of right foot with fat layer exposed: Secondary | ICD-10-CM | POA: Diagnosis not present

## 2016-11-05 DIAGNOSIS — L97512 Non-pressure chronic ulcer of other part of right foot with fat layer exposed: Secondary | ICD-10-CM | POA: Diagnosis not present

## 2016-11-05 DIAGNOSIS — E119 Type 2 diabetes mellitus without complications: Secondary | ICD-10-CM | POA: Diagnosis not present

## 2016-11-05 DIAGNOSIS — Z01812 Encounter for preprocedural laboratory examination: Secondary | ICD-10-CM | POA: Diagnosis not present

## 2016-11-06 DIAGNOSIS — E113511 Type 2 diabetes mellitus with proliferative diabetic retinopathy with macular edema, right eye: Secondary | ICD-10-CM | POA: Diagnosis not present

## 2016-11-07 DIAGNOSIS — R69 Illness, unspecified: Secondary | ICD-10-CM | POA: Diagnosis not present

## 2016-11-13 DIAGNOSIS — G4733 Obstructive sleep apnea (adult) (pediatric): Secondary | ICD-10-CM | POA: Diagnosis not present

## 2016-11-21 DIAGNOSIS — L97513 Non-pressure chronic ulcer of other part of right foot with necrosis of muscle: Secondary | ICD-10-CM | POA: Diagnosis not present

## 2016-11-21 DIAGNOSIS — L97512 Non-pressure chronic ulcer of other part of right foot with fat layer exposed: Secondary | ICD-10-CM | POA: Diagnosis not present

## 2016-11-26 DIAGNOSIS — L97512 Non-pressure chronic ulcer of other part of right foot with fat layer exposed: Secondary | ICD-10-CM | POA: Diagnosis not present

## 2016-12-10 DIAGNOSIS — M12271 Villonodular synovitis (pigmented), right ankle and foot: Secondary | ICD-10-CM | POA: Diagnosis not present

## 2016-12-17 DIAGNOSIS — L97512 Non-pressure chronic ulcer of other part of right foot with fat layer exposed: Secondary | ICD-10-CM | POA: Diagnosis not present

## 2016-12-30 DIAGNOSIS — E119 Type 2 diabetes mellitus without complications: Secondary | ICD-10-CM | POA: Diagnosis not present

## 2016-12-30 DIAGNOSIS — E784 Other hyperlipidemia: Secondary | ICD-10-CM | POA: Diagnosis not present

## 2016-12-30 DIAGNOSIS — I10 Essential (primary) hypertension: Secondary | ICD-10-CM | POA: Diagnosis not present

## 2016-12-30 DIAGNOSIS — N433 Hydrocele, unspecified: Secondary | ICD-10-CM | POA: Diagnosis not present

## 2016-12-30 DIAGNOSIS — E668 Other obesity: Secondary | ICD-10-CM | POA: Diagnosis not present

## 2016-12-30 DIAGNOSIS — N509 Disorder of male genital organs, unspecified: Secondary | ICD-10-CM | POA: Diagnosis not present

## 2016-12-30 DIAGNOSIS — R200 Anesthesia of skin: Secondary | ICD-10-CM | POA: Diagnosis not present

## 2017-01-28 DIAGNOSIS — L97511 Non-pressure chronic ulcer of other part of right foot limited to breakdown of skin: Secondary | ICD-10-CM | POA: Diagnosis not present

## 2017-02-24 DIAGNOSIS — H3582 Retinal ischemia: Secondary | ICD-10-CM | POA: Diagnosis not present

## 2017-02-24 DIAGNOSIS — H33103 Unspecified retinoschisis, bilateral: Secondary | ICD-10-CM | POA: Diagnosis not present

## 2017-02-24 DIAGNOSIS — E113593 Type 2 diabetes mellitus with proliferative diabetic retinopathy without macular edema, bilateral: Secondary | ICD-10-CM | POA: Diagnosis not present

## 2017-02-24 DIAGNOSIS — N430 Encysted hydrocele: Secondary | ICD-10-CM | POA: Diagnosis not present

## 2017-04-30 DIAGNOSIS — M795 Residual foreign body in soft tissue: Secondary | ICD-10-CM | POA: Diagnosis not present

## 2017-04-30 DIAGNOSIS — R5383 Other fatigue: Secondary | ICD-10-CM | POA: Diagnosis not present

## 2017-04-30 DIAGNOSIS — I10 Essential (primary) hypertension: Secondary | ICD-10-CM | POA: Diagnosis not present

## 2017-04-30 DIAGNOSIS — E11319 Type 2 diabetes mellitus with unspecified diabetic retinopathy without macular edema: Secondary | ICD-10-CM | POA: Diagnosis not present

## 2017-04-30 DIAGNOSIS — E784 Other hyperlipidemia: Secondary | ICD-10-CM | POA: Diagnosis not present

## 2017-04-30 DIAGNOSIS — Z6841 Body Mass Index (BMI) 40.0 and over, adult: Secondary | ICD-10-CM | POA: Diagnosis not present

## 2017-04-30 DIAGNOSIS — G4733 Obstructive sleep apnea (adult) (pediatric): Secondary | ICD-10-CM | POA: Diagnosis not present

## 2017-05-28 DIAGNOSIS — M25775 Osteophyte, left foot: Secondary | ICD-10-CM | POA: Diagnosis not present

## 2017-05-28 DIAGNOSIS — L97522 Non-pressure chronic ulcer of other part of left foot with fat layer exposed: Secondary | ICD-10-CM | POA: Diagnosis not present

## 2017-05-28 DIAGNOSIS — L97529 Non-pressure chronic ulcer of other part of left foot with unspecified severity: Secondary | ICD-10-CM | POA: Diagnosis not present

## 2017-06-04 DIAGNOSIS — R600 Localized edema: Secondary | ICD-10-CM | POA: Diagnosis not present

## 2017-06-04 DIAGNOSIS — L97529 Non-pressure chronic ulcer of other part of left foot with unspecified severity: Secondary | ICD-10-CM | POA: Diagnosis not present

## 2017-06-04 DIAGNOSIS — M86172 Other acute osteomyelitis, left ankle and foot: Secondary | ICD-10-CM | POA: Diagnosis not present

## 2017-06-13 DIAGNOSIS — T8189XA Other complications of procedures, not elsewhere classified, initial encounter: Secondary | ICD-10-CM | POA: Diagnosis not present

## 2017-06-13 DIAGNOSIS — M948X7 Other specified disorders of cartilage, ankle and foot: Secondary | ICD-10-CM | POA: Diagnosis not present

## 2017-06-13 DIAGNOSIS — H52223 Regular astigmatism, bilateral: Secondary | ICD-10-CM | POA: Diagnosis not present

## 2017-06-13 DIAGNOSIS — H524 Presbyopia: Secondary | ICD-10-CM | POA: Diagnosis not present

## 2017-06-13 DIAGNOSIS — M898X7 Other specified disorders of bone, ankle and foot: Secondary | ICD-10-CM | POA: Diagnosis not present

## 2017-06-13 DIAGNOSIS — M86172 Other acute osteomyelitis, left ankle and foot: Secondary | ICD-10-CM | POA: Diagnosis not present

## 2017-06-13 DIAGNOSIS — L97522 Non-pressure chronic ulcer of other part of left foot with fat layer exposed: Secondary | ICD-10-CM | POA: Diagnosis not present

## 2017-06-25 DIAGNOSIS — M25572 Pain in left ankle and joints of left foot: Secondary | ICD-10-CM | POA: Diagnosis not present

## 2017-06-25 DIAGNOSIS — L97522 Non-pressure chronic ulcer of other part of left foot with fat layer exposed: Secondary | ICD-10-CM | POA: Diagnosis not present

## 2017-06-30 DIAGNOSIS — H5213 Myopia, bilateral: Secondary | ICD-10-CM | POA: Diagnosis not present

## 2017-06-30 DIAGNOSIS — H524 Presbyopia: Secondary | ICD-10-CM | POA: Diagnosis not present

## 2017-06-30 DIAGNOSIS — H52209 Unspecified astigmatism, unspecified eye: Secondary | ICD-10-CM | POA: Diagnosis not present

## 2017-07-02 ENCOUNTER — Encounter: Payer: Self-pay | Admitting: Neurology

## 2017-07-02 ENCOUNTER — Ambulatory Visit (INDEPENDENT_AMBULATORY_CARE_PROVIDER_SITE_OTHER): Payer: Medicare HMO | Admitting: Neurology

## 2017-07-02 ENCOUNTER — Encounter (INDEPENDENT_AMBULATORY_CARE_PROVIDER_SITE_OTHER): Payer: Self-pay

## 2017-07-02 VITALS — BP 136/69 | HR 77 | Ht 71.0 in | Wt 315.0 lb

## 2017-07-02 DIAGNOSIS — G4733 Obstructive sleep apnea (adult) (pediatric): Secondary | ICD-10-CM | POA: Diagnosis not present

## 2017-07-02 DIAGNOSIS — Z9989 Dependence on other enabling machines and devices: Secondary | ICD-10-CM

## 2017-07-02 DIAGNOSIS — E113593 Type 2 diabetes mellitus with proliferative diabetic retinopathy without macular edema, bilateral: Secondary | ICD-10-CM | POA: Diagnosis not present

## 2017-07-02 DIAGNOSIS — H33193 Other retinoschisis and retinal cysts, bilateral: Secondary | ICD-10-CM | POA: Diagnosis not present

## 2017-07-02 DIAGNOSIS — H3582 Retinal ischemia: Secondary | ICD-10-CM | POA: Diagnosis not present

## 2017-07-02 NOTE — Patient Instructions (Addendum)
You are fully compliant with CPAP. It takes care of your sleep apnea quite well at this time. I would not recommend pursuing a sleep study quite yet as you are still dealing with the foot infection and are not able to sleep very well. We would have to wait things out just a little bit. In addition, you have left hip pain and may be facing left hip replacement surgery and I would recommend that you are stable in that regard as well before we resort to bringing her back for another sleep study.  Please try to limit your time in bed. We recommend 7-8 hours of sleep for the average adult.  I think you're daytime tiredness is likely from a combination of things including having recurrent infections and taking high doses of antibiotics long-term for this. In addition, you have trouble with your hip and suffer from neuropathy. This can cause sleep disruption from discomfort and pain. Taking gabapentin at this dose can also cause daytime tiredness and side effects including sleepiness during the day.  I would recommend that we reevaluate things including your symptoms and the compliance data in about 6 months from now and we can consider bringing you back for a sleep study at the time. You do not have to bring your machine at the time so long as you can bring the compliance data card from your machine at the visit.

## 2017-07-02 NOTE — Progress Notes (Signed)
Subjective:    Patient ID: Joseph Hinton is a 51 y.o. male.  HPI      Joseph Foley, MD, PhD [facility] [address] [address] [place], [place] [address]  Dear Dr. Link Snuffer,   I saw your patient, Joseph Hinton, upon your kind request in my neurologic clinic today for initial consultation of his sleep disorder, in particular, evaluation of his obstructive sleep apnea. The patient is accompanied by his wife today. As you know, Joseph Hinton is a 51 year old right-handed gentleman with an underlying medical history of type 2 diabetes, with associated neuropathy and history of retinal hemorrhage with status post laser surgery, history of PVCs, hypertension, hyperlipidemia, osteoarthritis and recurrent foot infections and left foot osteomyelitis, and morbid obesity with a BMI of over 40, who was previously diagnosed with obstructive sleep apnea and placed on CPAP therapy. Sleep study testing was many years ago and his CPAP machine is several years old, likely from 2011, per patient report. I was able to review his compliance data from the card and his machine. This was from 06/03/2017 through 07/02/2017, last 30 days, during which time he used his machine 29 days with percent used days greater than 4 hours at 93%, indicating excellent compliance with a high average usage of 11 hours and 22 minutes, which is quite high. Leak is in the acceptable range with the 95th percentile at 19.8 L/m, pressure at 18 cm. Prior sleep study results are not available for my review today. I reviewed your office note from 04/30/2017, which you kindly included.  He is status post sleep apnea surgery including tonsillectomy, UPPP. He quit smoking in 2000. He lives with his wife. He is on disability.  His original Dx for OSA, he recalls, was in 1999, pressure, as he recalls was originally 13 cm, then at some point 15 cm and most recently 18 cm. He recalls being told, he had severe OSA. His weight  has fluctuated. He is currently wearing a boot from his foot surgery and recurrent foot infection. He also had surgery in the past to his right foot. For his diabetic neuropathy he is on high-dose gabapentin, 600 mg 3 times a day. His main complaint is not being rested during the day and feeling tired all the time. He's not frankly sleepy. Epworth Sleepiness Scale score is 0 out of 24 today, fatigue score is 63 out of 63. He does not have night to night nocturia. He is not aware of any family history of obstructive sleep apnea. In order to elevate his legs he spends a lot of time in bed. He is not indicating any issues with his current CPAP machine. We called his DME company and are advised that he has had this machine since November 2011. [facility] also does not have the most recent results of his sleep study. He does not have any issues with the CPAP pressure or the mask and indicates full compliance with treatment. He quit smoking in 2001. He does not currently drink any alcohol. He drinks caffeine in the form of coffee, 2-3 cups per day. He is on disability. He lives with his wife, they have 1 child.   His Past Medical History Is Significant For: Past Medical History:  Diagnosis Date  . Diabetes mellitus without complication (HCC)   . Foot infection   . Hyperlipidemia   . Hypertension   . Neuropathy   . OA (osteoarthritis of spine)   . OSA on CPAP   .  PVC's (premature ventricular contractions)   . Retinal hemorrhage, right     His Past Surgical History Is Significant For: Past Surgical History:  Procedure Laterality Date  . TONSILLECTOMY AND ADENOIDECTOMY    . uvulaectomy      His Family History Is Significant For: Family History  Problem Relation Age of Onset  . Stroke Mother   . Diabetes Mother   . Hypertension Mother   . Nephrolithiasis Brother   . Lung cancer Father     His Social History Is Significant For: Social History   Social History  . Marital status:  Married    Spouse name: N/A  . Number of children: N/A  . Years of education: N/A   Social History Main Topics  . Smoking status: Former Games developermoker  . Smokeless tobacco: Never Used  . Alcohol use No     Comment: quit in 1997  . Drug use: No  . Sexual activity: Not Asked   Other Topics Concern  . None   Social History Narrative  . None    His Allergies Are:  No Known Allergies:   His Current Medications Are:  Outpatient Encounter Prescriptions as of 07/02/2017  Medication Sig  . ACCU-CHEK AVIVA PLUS test strip USE AS DIRECTED TO CHECK BLOOD SUGARS TWICE DAILY  . atorvastatin (LIPITOR) 10 MG tablet Take 10 mg by mouth daily.  . cephALEXin (KEFLEX) 500 MG capsule Take 1,000 mg by mouth 2 (two) times daily.  . enalapril (VASOTEC) 10 MG tablet Take 10 mg by mouth daily.  Marland Kitchen. gabapentin (NEURONTIN) 600 MG tablet Take 600 mg by mouth 3 (three) times daily.  Marland Kitchen. glimepiride (AMARYL) 4 MG tablet Take 0.5 tablets by mouth 2 (two) times daily.  . metFORMIN (GLUCOPHAGE) 1000 MG tablet Take 1,000 mg by mouth 2 (two) times daily.  . rifampin (RIFADIN) 150 MG capsule Take 1 capsule by mouth daily.   No facility-administered encounter medications on file as of 07/02/2017.   :  Review of Systems:  Out of a complete 14 point review of systems, all are reviewed and negative with the exception of these symptoms as listed below: Review of Systems  Neurological:       Pt presents today to discuss his cpap. Pt has not had a sleep study in a long time. Pt uses his cpap every night but feels tired during the day. Pt uses [facility] as his DME.  Epworth Sleepiness Scale 0= would never doze 1= slight chance of dozing 2= moderate chance of dozing 3= high chance of dozing  Sitting and reading: 0 Watching TV: 0 Sitting inactive in a public place (ex. Theater or meeting): 0 As a passenger in a car for an hour without a break: 0 Lying down to rest in the afternoon: 0 Sitting and talking to someone: 0 Sitting  quietly after lunch (no alcohol): 0 In a car, while stopped in traffic: 0 Total: 0     Objective:  Neurological Exam  Physical Exam Physical Examination:   Vitals:   07/02/17 1331  BP: 136/69  Pulse: 77    General Examination: The patient is a very pleasant 51 y.o. male in no acute distress. He appears well-developed and well-nourished and adequately groomed.   HEENT: Normocephalic, atraumatic, pupils are equal, round and reactive to light and accommodation. Extraocular tracking is good without limitation to gaze excursion or nystagmus noted. Normal smooth pursuit is noted. Hearing is grossly intact. Face is symmetric with normal facial animation and normal facial sensation.  Speech is clear with no dysarthria noted. There is no hypophonia. There is no lip, neck/head, jaw or voice tremor. Neck is supple with full range of passive and active motion. There are no carotid bruits on auscultation. Oropharynx exam reveals: moderate mouth dryness, adequate dental hygiene and s/p UPPP. Tongue protrudes centrally and palate elevates symmetrically. Tonsils absent. Neck size is larger.   Chest: Clear to auscultation without wheezing, rhonchi or crackles noted.  Heart: S1+S2+0, regular and normal without murmurs, rubs or gallops noted.   Abdomen: Soft, non-tender and non-distended with normal bowel sounds appreciated on auscultation.  Extremities: There is 2+ pitting edema in the distal lower extremities bilaterally.   Skin: Chronic appearing hyperpigmentation and discoloration in the distal lower extremities bilaterally.  Musculoskeletal: exam reveals left foot and improved. Left hip pain.    Neurologically:  Mental status: The patient is awake, alert and oriented in all 4 spheres. His immediate and remote memory, attention, language skills and fund of knowledge are appropriate. There is no evidence of aphasia, agnosia, apraxia or anomia. Speech is clear with normal prosody and enunciation.  Thought process is linear. Mood is normal and affect is normal.  Cranial nerves II - XII are as described above under HEENT exam. In addition: shoulder shrug is normal with equal shoulder height noted. Motor exam: Normal bulk, strength and tone is noted. There is no drift, tremor or rebound. Romberg is negative. Reflexes are 1+ in the upper extremities, absent in the lower extremities. Reduced sensation noted in the distal lower extremities. Fine motor skills are difficult to assess in the lower extremities, adequate in the upper extremities. No intention tremor or dysmetria, no resting tremor. Heel-to-shin is not possible.  Gait, station and balance: He stands With difficulty and has a limp.   Assessment and Plan:  In summary, Andi Mahaffy is a very pleasant 51 y.o.-year old male with an underlying medical history of type 2 diabetes, with associated neuropathy and history of retinal hemorrhage with status post laser surgery, history of PVCs, hypertension, hyperlipidemia, osteoarthritis and recurrent foot infections and left foot osteomyelitis, and morbid obesity with a BMI of over 40, who presents for evaluation of his prior diagnosis of OSA. He has been on CPAP therapy for years. He is currently on a CPAP at a pressure of 18 cm with full compliance and good results as far as apnea control. Nevertheless, he reports tiredness and fatigue and lack of energy, which I believe is due to a combination of factors including recent surgery, recurrent osteomyelitis and having to take longer term antibiotics and also taking gabapentin for neuropathy which can cause daytime tiredness and sleepiness. I cautioned the patient to not spend too much time in bed. He is limited also with respect to his mobility secondary to his left hip pain and maybe facing left hip replacement as soon as possible as I understand. As of right now, I do not suggest that we proceed with repeat sleep study testing because he is adequately treated  with his current CPAP settings and his machine seems to be working and he is compliant. He is wearing of blood on the left foot and is not sleeping very well, disturbed by pain and discomfort in his foot and hip. This would not be a good time to do sleep study testing. I encouraged him to wait things out a little longer and suggested a six-month recheck, at which time we can review his symptoms again and also reviewed his compliance. He has  been receiving his supplies from [facility]. I answered all their questions today and the patient and his wife were in agreement.   Thank you very much for allowing me to participate in the care of this nice patient. If I can be of any further assistance to you please do not hesitate to call me at [phone].  Sincerely,   Joseph Foley, MD, PhD

## 2017-07-10 DIAGNOSIS — L97522 Non-pressure chronic ulcer of other part of left foot with fat layer exposed: Secondary | ICD-10-CM | POA: Diagnosis not present

## 2017-07-14 DIAGNOSIS — G4733 Obstructive sleep apnea (adult) (pediatric): Secondary | ICD-10-CM | POA: Diagnosis not present

## 2017-07-24 DIAGNOSIS — L97522 Non-pressure chronic ulcer of other part of left foot with fat layer exposed: Secondary | ICD-10-CM | POA: Diagnosis not present

## 2017-07-24 DIAGNOSIS — M86172 Other acute osteomyelitis, left ankle and foot: Secondary | ICD-10-CM | POA: Diagnosis not present

## 2017-07-29 DIAGNOSIS — Z136 Encounter for screening for cardiovascular disorders: Secondary | ICD-10-CM | POA: Diagnosis not present

## 2017-07-29 DIAGNOSIS — Z6841 Body Mass Index (BMI) 40.0 and over, adult: Secondary | ICD-10-CM | POA: Diagnosis not present

## 2017-07-29 DIAGNOSIS — L309 Dermatitis, unspecified: Secondary | ICD-10-CM | POA: Diagnosis not present

## 2017-08-13 DIAGNOSIS — G4733 Obstructive sleep apnea (adult) (pediatric): Secondary | ICD-10-CM | POA: Diagnosis not present

## 2017-08-14 DIAGNOSIS — L97522 Non-pressure chronic ulcer of other part of left foot with fat layer exposed: Secondary | ICD-10-CM | POA: Diagnosis not present

## 2017-08-28 DIAGNOSIS — L97521 Non-pressure chronic ulcer of other part of left foot limited to breakdown of skin: Secondary | ICD-10-CM | POA: Diagnosis not present

## 2017-08-28 DIAGNOSIS — L600 Ingrowing nail: Secondary | ICD-10-CM | POA: Diagnosis not present

## 2017-09-10 DIAGNOSIS — L97521 Non-pressure chronic ulcer of other part of left foot limited to breakdown of skin: Secondary | ICD-10-CM | POA: Diagnosis not present

## 2017-09-10 DIAGNOSIS — T8189XA Other complications of procedures, not elsewhere classified, initial encounter: Secondary | ICD-10-CM | POA: Diagnosis not present

## 2017-09-13 DIAGNOSIS — G4733 Obstructive sleep apnea (adult) (pediatric): Secondary | ICD-10-CM | POA: Diagnosis not present

## 2017-10-02 DIAGNOSIS — I10 Essential (primary) hypertension: Secondary | ICD-10-CM | POA: Diagnosis not present

## 2017-10-02 DIAGNOSIS — M1611 Unilateral primary osteoarthritis, right hip: Secondary | ICD-10-CM | POA: Diagnosis not present

## 2017-10-02 DIAGNOSIS — R82998 Other abnormal findings in urine: Secondary | ICD-10-CM | POA: Diagnosis not present

## 2017-10-02 DIAGNOSIS — E291 Testicular hypofunction: Secondary | ICD-10-CM | POA: Diagnosis not present

## 2017-10-02 DIAGNOSIS — Z0000 Encounter for general adult medical examination without abnormal findings: Secondary | ICD-10-CM | POA: Diagnosis not present

## 2017-10-02 DIAGNOSIS — E7849 Other hyperlipidemia: Secondary | ICD-10-CM | POA: Diagnosis not present

## 2017-10-02 DIAGNOSIS — N5089 Other specified disorders of the male genital organs: Secondary | ICD-10-CM | POA: Diagnosis not present

## 2017-10-02 DIAGNOSIS — Z125 Encounter for screening for malignant neoplasm of prostate: Secondary | ICD-10-CM | POA: Diagnosis not present

## 2017-10-02 DIAGNOSIS — G4733 Obstructive sleep apnea (adult) (pediatric): Secondary | ICD-10-CM | POA: Diagnosis not present

## 2017-10-02 DIAGNOSIS — E1121 Type 2 diabetes mellitus with diabetic nephropathy: Secondary | ICD-10-CM | POA: Diagnosis not present

## 2017-10-02 DIAGNOSIS — M1612 Unilateral primary osteoarthritis, left hip: Secondary | ICD-10-CM | POA: Diagnosis not present

## 2017-10-02 DIAGNOSIS — L97521 Non-pressure chronic ulcer of other part of left foot limited to breakdown of skin: Secondary | ICD-10-CM | POA: Diagnosis not present

## 2017-10-02 DIAGNOSIS — E11621 Type 2 diabetes mellitus with foot ulcer: Secondary | ICD-10-CM | POA: Diagnosis not present

## 2017-10-02 DIAGNOSIS — E1140 Type 2 diabetes mellitus with diabetic neuropathy, unspecified: Secondary | ICD-10-CM | POA: Diagnosis not present

## 2017-10-13 DIAGNOSIS — G4733 Obstructive sleep apnea (adult) (pediatric): Secondary | ICD-10-CM | POA: Diagnosis not present

## 2017-10-13 DIAGNOSIS — Z1212 Encounter for screening for malignant neoplasm of rectum: Secondary | ICD-10-CM | POA: Diagnosis not present

## 2017-10-13 DIAGNOSIS — Z1211 Encounter for screening for malignant neoplasm of colon: Secondary | ICD-10-CM | POA: Diagnosis not present

## 2017-10-17 DIAGNOSIS — Z1212 Encounter for screening for malignant neoplasm of rectum: Secondary | ICD-10-CM | POA: Diagnosis not present

## 2017-10-31 DIAGNOSIS — G4733 Obstructive sleep apnea (adult) (pediatric): Secondary | ICD-10-CM | POA: Diagnosis not present

## 2017-12-31 ENCOUNTER — Ambulatory Visit: Payer: Medicare HMO | Admitting: Neurology

## 2018-12-10 DIAGNOSIS — G4733 Obstructive sleep apnea (adult) (pediatric): Secondary | ICD-10-CM | POA: Diagnosis not present

## 2018-12-16 DIAGNOSIS — Z1212 Encounter for screening for malignant neoplasm of rectum: Secondary | ICD-10-CM | POA: Diagnosis not present

## 2019-01-06 DIAGNOSIS — H52223 Regular astigmatism, bilateral: Secondary | ICD-10-CM | POA: Diagnosis not present

## 2019-01-06 DIAGNOSIS — E669 Obesity, unspecified: Secondary | ICD-10-CM | POA: Diagnosis not present

## 2019-01-06 DIAGNOSIS — H524 Presbyopia: Secondary | ICD-10-CM | POA: Diagnosis not present

## 2019-01-06 DIAGNOSIS — E1169 Type 2 diabetes mellitus with other specified complication: Secondary | ICD-10-CM | POA: Diagnosis not present

## 2019-01-06 DIAGNOSIS — I10 Essential (primary) hypertension: Secondary | ICD-10-CM | POA: Diagnosis not present

## 2019-02-18 DIAGNOSIS — G4733 Obstructive sleep apnea (adult) (pediatric): Secondary | ICD-10-CM | POA: Diagnosis not present

## 2019-03-09 DIAGNOSIS — I10 Essential (primary) hypertension: Secondary | ICD-10-CM | POA: Diagnosis not present

## 2019-03-09 DIAGNOSIS — R0781 Pleurodynia: Secondary | ICD-10-CM | POA: Diagnosis not present

## 2019-03-09 DIAGNOSIS — E1169 Type 2 diabetes mellitus with other specified complication: Secondary | ICD-10-CM | POA: Diagnosis not present

## 2019-04-13 DIAGNOSIS — E11319 Type 2 diabetes mellitus with unspecified diabetic retinopathy without macular edema: Secondary | ICD-10-CM | POA: Diagnosis not present

## 2019-04-13 DIAGNOSIS — H33193 Other retinoschisis and retinal cysts, bilateral: Secondary | ICD-10-CM | POA: Diagnosis not present

## 2019-04-13 DIAGNOSIS — H2513 Age-related nuclear cataract, bilateral: Secondary | ICD-10-CM | POA: Diagnosis not present

## 2019-04-13 DIAGNOSIS — I10 Essential (primary) hypertension: Secondary | ICD-10-CM | POA: Diagnosis not present

## 2019-04-13 DIAGNOSIS — E669 Obesity, unspecified: Secondary | ICD-10-CM | POA: Diagnosis not present

## 2019-04-13 DIAGNOSIS — E113593 Type 2 diabetes mellitus with proliferative diabetic retinopathy without macular edema, bilateral: Secondary | ICD-10-CM | POA: Diagnosis not present

## 2019-04-13 DIAGNOSIS — E1169 Type 2 diabetes mellitus with other specified complication: Secondary | ICD-10-CM | POA: Diagnosis not present

## 2019-04-13 DIAGNOSIS — H3582 Retinal ischemia: Secondary | ICD-10-CM | POA: Diagnosis not present

## 2019-04-13 DIAGNOSIS — E1140 Type 2 diabetes mellitus with diabetic neuropathy, unspecified: Secondary | ICD-10-CM | POA: Diagnosis not present

## 2019-04-13 DIAGNOSIS — G4733 Obstructive sleep apnea (adult) (pediatric): Secondary | ICD-10-CM | POA: Diagnosis not present

## 2019-04-13 DIAGNOSIS — E7849 Other hyperlipidemia: Secondary | ICD-10-CM | POA: Diagnosis not present

## 2019-06-03 DIAGNOSIS — M542 Cervicalgia: Secondary | ICD-10-CM | POA: Diagnosis not present

## 2019-06-03 DIAGNOSIS — M9903 Segmental and somatic dysfunction of lumbar region: Secondary | ICD-10-CM | POA: Diagnosis not present

## 2019-06-03 DIAGNOSIS — M5116 Intervertebral disc disorders with radiculopathy, lumbar region: Secondary | ICD-10-CM | POA: Diagnosis not present

## 2019-06-03 DIAGNOSIS — M546 Pain in thoracic spine: Secondary | ICD-10-CM | POA: Diagnosis not present

## 2019-06-07 DIAGNOSIS — M9903 Segmental and somatic dysfunction of lumbar region: Secondary | ICD-10-CM | POA: Diagnosis not present

## 2019-06-07 DIAGNOSIS — M5116 Intervertebral disc disorders with radiculopathy, lumbar region: Secondary | ICD-10-CM | POA: Diagnosis not present

## 2019-06-07 DIAGNOSIS — M546 Pain in thoracic spine: Secondary | ICD-10-CM | POA: Diagnosis not present

## 2019-06-07 DIAGNOSIS — M542 Cervicalgia: Secondary | ICD-10-CM | POA: Diagnosis not present

## 2019-06-10 DIAGNOSIS — M5116 Intervertebral disc disorders with radiculopathy, lumbar region: Secondary | ICD-10-CM | POA: Diagnosis not present

## 2019-06-10 DIAGNOSIS — M542 Cervicalgia: Secondary | ICD-10-CM | POA: Diagnosis not present

## 2019-06-10 DIAGNOSIS — M9903 Segmental and somatic dysfunction of lumbar region: Secondary | ICD-10-CM | POA: Diagnosis not present

## 2019-06-10 DIAGNOSIS — M546 Pain in thoracic spine: Secondary | ICD-10-CM | POA: Diagnosis not present

## 2019-06-17 DIAGNOSIS — M542 Cervicalgia: Secondary | ICD-10-CM | POA: Diagnosis not present

## 2019-06-17 DIAGNOSIS — M546 Pain in thoracic spine: Secondary | ICD-10-CM | POA: Diagnosis not present

## 2019-06-17 DIAGNOSIS — M9903 Segmental and somatic dysfunction of lumbar region: Secondary | ICD-10-CM | POA: Diagnosis not present

## 2019-06-17 DIAGNOSIS — M5116 Intervertebral disc disorders with radiculopathy, lumbar region: Secondary | ICD-10-CM | POA: Diagnosis not present

## 2019-06-24 DIAGNOSIS — M546 Pain in thoracic spine: Secondary | ICD-10-CM | POA: Diagnosis not present

## 2019-06-24 DIAGNOSIS — M9903 Segmental and somatic dysfunction of lumbar region: Secondary | ICD-10-CM | POA: Diagnosis not present

## 2019-06-24 DIAGNOSIS — M5116 Intervertebral disc disorders with radiculopathy, lumbar region: Secondary | ICD-10-CM | POA: Diagnosis not present

## 2019-06-24 DIAGNOSIS — M542 Cervicalgia: Secondary | ICD-10-CM | POA: Diagnosis not present

## 2019-07-01 DIAGNOSIS — M542 Cervicalgia: Secondary | ICD-10-CM | POA: Diagnosis not present

## 2019-07-01 DIAGNOSIS — M5116 Intervertebral disc disorders with radiculopathy, lumbar region: Secondary | ICD-10-CM | POA: Diagnosis not present

## 2019-07-01 DIAGNOSIS — M546 Pain in thoracic spine: Secondary | ICD-10-CM | POA: Diagnosis not present

## 2019-07-01 DIAGNOSIS — M9903 Segmental and somatic dysfunction of lumbar region: Secondary | ICD-10-CM | POA: Diagnosis not present

## 2019-07-14 ENCOUNTER — Other Ambulatory Visit: Payer: Self-pay

## 2019-07-14 ENCOUNTER — Ambulatory Visit (INDEPENDENT_AMBULATORY_CARE_PROVIDER_SITE_OTHER): Payer: Medicare HMO

## 2019-07-14 ENCOUNTER — Encounter: Payer: Self-pay | Admitting: Orthopaedic Surgery

## 2019-07-14 ENCOUNTER — Ambulatory Visit (INDEPENDENT_AMBULATORY_CARE_PROVIDER_SITE_OTHER): Payer: Medicare HMO | Admitting: Orthopaedic Surgery

## 2019-07-14 VITALS — Ht 71.0 in | Wt 288.0 lb

## 2019-07-14 DIAGNOSIS — E669 Obesity, unspecified: Secondary | ICD-10-CM | POA: Diagnosis not present

## 2019-07-14 DIAGNOSIS — W19XXXA Unspecified fall, initial encounter: Secondary | ICD-10-CM | POA: Diagnosis not present

## 2019-07-14 DIAGNOSIS — M1612 Unilateral primary osteoarthritis, left hip: Secondary | ICD-10-CM | POA: Diagnosis not present

## 2019-07-14 DIAGNOSIS — M25552 Pain in left hip: Secondary | ICD-10-CM

## 2019-07-14 DIAGNOSIS — S3992XA Unspecified injury of lower back, initial encounter: Secondary | ICD-10-CM | POA: Diagnosis not present

## 2019-07-14 DIAGNOSIS — I10 Essential (primary) hypertension: Secondary | ICD-10-CM | POA: Diagnosis not present

## 2019-07-14 DIAGNOSIS — E1169 Type 2 diabetes mellitus with other specified complication: Secondary | ICD-10-CM | POA: Diagnosis not present

## 2019-07-14 NOTE — Progress Notes (Signed)
Office Visit Note   Patient: Joseph SheererJames Hillmer           Date of Birth: [birth year]           MRN: [id number] Visit Date: 07/14/2019              Requested by: Alysia PennaHolwerda, Scott, MD [address] [place],  [place] [id number] PCP: Alysia PennaHolwerda, Scott, MD   Assessment & Plan: Visit Diagnoses:  1. Pain in left hip   2. Unilateral primary osteoarthritis, left hip     Plan: At this point I am comfortable proceeding with a total hip arthroplasty on the left side.  He is lost 61 pounds and his blood glucose is under excellent control.  We will work on setting him up for surgery for hopefully October 23.  I would like to see him back in 4 weeks for repeat weight to see that he still trending in the right direction but I see no reason to not proceed with hip replacement surgery at this point.  I spent some time in the office showing him his x-rays.  We went over hip replacement model explained in detail what the surgery involves.  We talked about the risk and benefits.  We talked about the preoperative and postoperative course and intraoperative course.  All question concerns were answered addressed.  Again we will see him back in 4 weeks with no x-rays are needed I would just like to wait and we can talk to him about the details of surgery at that standpoint but we will go ahead and schedule this as well because I am comfortable with proceeding with surgery I do feel that is medically necessary at this point.  Follow-Up Instructions: Return in about 4 weeks (around 08/11/2019).   Orders:  Orders Placed This Encounter  Procedures  . XR HIP UNILAT W OR W/O PELVIS 1V LEFT   No orders of the defined types were placed in this encounter.     Procedures: No procedures performed   Clinical Data: No additional findings.   Subjective: No chief complaint on file. The patient is someone well-known to me.  I have actually seen him in the past but is been over 3 years.  This is for debilitating arthritis  involving his left hip.  At one point he was a poorly controlled diabetic and he did have surgeries on both feet at one point.  [place].  He still has debilitating arthritis and pain involving his left hip.  He now has good diabetic control with a hemoglobin A1c of 6.2 this morning.  His weight is down from a high of 249 pounds now to just 288 pounds.  His BMI is only 40.17.  This is down significantly.  His hip pain is become debilitating.  He walks with a significant Trendelenburg gait.  It is detriment affect his mobility, his quality of life and his activities daily living.  He does take anti-inflammatories.  He is worked on weight loss and activity modification.  His pain is 10-10 and he is in need of hip replacement surgery at this standpoint.  HPI  Review of Systems He currently denies any headache, chest pain, shortness of breath, fever, chills, vomiting, nausea  Objective: Vital Signs: Ht 5\' 11"  (1.803 m)   Wt 288 lb (130.6 kg)   BMI 40.17 kg/m   Physical Exam He is alert and orient x3 in no acute distress Ortho Exam He does walk with  a significant Trendelenburg gait.  His left hip has essentially most no rotation to it at all and his pain is severe when I attempt to rotate his left hip.  His right hip exam is normal.  There is a slight leg length discrepancy with his left side being shorter than the right.  Laying him supine on the exam table I can easily get to his left hip. Specialty Comments:  No specialty comments available.  Imaging: Xr Hip Unilat W Or W/o Pelvis 1v Left  Result Date: 07/14/2019 An AP pelvis and lateral left hip shows complete loss of the left hip joint.  There is essentially no space remaining.  There is particular osteophytes and sclerotic changes as well as cystic changes.  This is severe end-stage arthritis.    PMFS History: Patient Active Problem List   Diagnosis Date Noted  . Unilateral primary osteoarthritis, left  hip 07/14/2019  . DIABETES MELLITUS, TYPE II, UNCONTROLLED 09/29/2007  . DIABETIC PERIPHERAL NEUROPATHY 09/28/2007  . HYPERLIPIDEMIA 09/28/2007  . OBESITY 09/28/2007  . NICOTINE ADDICTION 09/28/2007  . OBSTRUCTIVE SLEEP APNEA 09/28/2007  . CATARACTS 09/28/2007  . HYPERTENSION 09/28/2007  . OSTEOARTHRITIS 09/28/2007  . LOW BACK PAIN 09/28/2007  . FOOT PAIN, LEFT 09/28/2007   Past Medical History:  Diagnosis Date  . Diabetes mellitus without complication ([place])   . Foot infection   . Hyperlipidemia   . Hypertension   . Neuropathy   . OA (osteoarthritis of spine)   . OSA on CPAP   . PVC's (premature ventricular contractions)   . Retinal hemorrhage, right     Family History  Problem Relation Age of Onset  . Stroke Mother   . Diabetes Mother   . Hypertension Mother   . Nephrolithiasis Brother   . Lung cancer Father     Past Surgical History:  Procedure Laterality Date  . TONSILLECTOMY AND ADENOIDECTOMY    . uvulaectomy     Social History   Occupational History  . Not on file  Tobacco Use  . Smoking status: Former Research scientist (life sciences)  . Smokeless tobacco: Never Used  Substance and Sexual Activity  . Alcohol use: No    Comment: quit in 1997  . Drug use: No  . Sexual activity: Not on file

## 2019-07-15 DIAGNOSIS — M9903 Segmental and somatic dysfunction of lumbar region: Secondary | ICD-10-CM | POA: Diagnosis not present

## 2019-07-15 DIAGNOSIS — M5116 Intervertebral disc disorders with radiculopathy, lumbar region: Secondary | ICD-10-CM | POA: Diagnosis not present

## 2019-07-15 DIAGNOSIS — M542 Cervicalgia: Secondary | ICD-10-CM | POA: Diagnosis not present

## 2019-07-15 DIAGNOSIS — M546 Pain in thoracic spine: Secondary | ICD-10-CM | POA: Diagnosis not present

## 2019-08-11 ENCOUNTER — Ambulatory Visit (INDEPENDENT_AMBULATORY_CARE_PROVIDER_SITE_OTHER): Payer: Medicare HMO | Admitting: Orthopaedic Surgery

## 2019-08-11 ENCOUNTER — Other Ambulatory Visit: Payer: Self-pay

## 2019-08-11 ENCOUNTER — Encounter: Payer: Self-pay | Admitting: Orthopaedic Surgery

## 2019-08-11 VITALS — Wt 286.0 lb

## 2019-08-11 DIAGNOSIS — E6609 Other obesity due to excess calories: Secondary | ICD-10-CM | POA: Diagnosis not present

## 2019-08-11 DIAGNOSIS — M1612 Unilateral primary osteoarthritis, left hip: Secondary | ICD-10-CM | POA: Diagnosis not present

## 2019-08-11 DIAGNOSIS — Z6839 Body mass index (BMI) 39.0-39.9, adult: Secondary | ICD-10-CM

## 2019-08-11 DIAGNOSIS — M25552 Pain in left hip: Secondary | ICD-10-CM | POA: Diagnosis not present

## 2019-08-11 NOTE — Progress Notes (Signed)
The patient is well-known to me.  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  His pain is daily and it is detriment affecting his mobility, his quality of life and his activities daily living.  He is a diabetic but now has good blood glucose control.  He has been morbidly obese before but is now worked on significant weight loss.  He is now down to a BMI of 39.8.  He is lost almost 70 pounds at this standpoint.  I am concerned about his fall risk given the severity of his arthritis.  On exam his left hip is incredibly stiff but his right hip moves normally.  We did go over his x-rays again from his last visit showed him the severity of his arthritis.  I feel comfortable at this point setting him up for a total hip arthroplasty.  He will continue to lose weight prior to surgery.  I spent a significant time in the office today showing a hip model.  We went over his x-rays.  We described what hip replacement surgery involves.  We talked in detail about the risk and benefits of surgery as well as interoperative and postoperative course.  Given the failure of conservative treatment for over a year and given the severity of his arthritis with almost no joint space remaining at this point with significant large para-articular osteophytes as well as sclerotic and cystic changes, a left total hip arthroplasty is definitely warranted.  All question concerns were answered and addressed.  We will work on getting him scheduled for the surgery which I feel is medically necessary at this standpoint.

## 2019-08-13 DIAGNOSIS — G4733 Obstructive sleep apnea (adult) (pediatric): Secondary | ICD-10-CM | POA: Diagnosis not present

## 2019-08-26 DIAGNOSIS — M546 Pain in thoracic spine: Secondary | ICD-10-CM | POA: Diagnosis not present

## 2019-08-26 DIAGNOSIS — M9903 Segmental and somatic dysfunction of lumbar region: Secondary | ICD-10-CM | POA: Diagnosis not present

## 2019-08-26 DIAGNOSIS — M5116 Intervertebral disc disorders with radiculopathy, lumbar region: Secondary | ICD-10-CM | POA: Diagnosis not present

## 2019-08-26 DIAGNOSIS — M542 Cervicalgia: Secondary | ICD-10-CM | POA: Diagnosis not present

## 2019-09-01 ENCOUNTER — Other Ambulatory Visit: Payer: Self-pay

## 2019-09-02 ENCOUNTER — Other Ambulatory Visit: Payer: Self-pay | Admitting: Physician Assistant

## 2019-09-06 NOTE — Patient Instructions (Addendum)
DUE TO COVID-19 ONLY ONE VISITOR IS ALLOWED TO COME WITH YOU AND STAY IN THE WAITING ROOM ONLY DURING PRE OP AND PROCEDURE DAY OF SURGERY. THE 1 VISITOR MAY VISIT WITH YOU AFTER SURGERY IN YOUR PRIVATE ROOM DURING VISITING HOURS ONLY!  YOU NEED TO HAVE A COVID 19 TEST ON__11/3_____ @_______ , THIS TEST MUST BE DONE BEFORE SURGERY, COME  [place], [address] [place] , [address].  ([place])   ONCE YOUR COVID TEST IS COMPLETED, PLEASE BEGIN THE QUARANTINE INSTRUCTIONS AS OUTLINED IN YOUR HANDOUT.                Joseph Hinton   Your procedure is scheduled on: 09/10/19   Report to [facility] Main  Entrance Report to admitting at  8:30 AM     Call this number if you have problems the morning of surgery [phone]   . BRUSH YOUR TEETH MORNING OF SURGERY AND RINSE YOUR MOUTH OUT, NO CHEWING GUM CANDY OR MINTS.   Do not eat food After Midnight.   YOU MAY HAVE CLEAR LIQUIDS FROM MIDNIGHT UNTIL 8:00 AM.   CLEAR LIQUID DIET   Foods Allowed                                                                     Foods Excluded  Coffee and tea, regular and decaf                             liquids that you cannot  Plain Jell-O any favor except red or purple                                           see through such as: Fruit ices (not with fruit pulp)                                     milk, soups, orange juice  Iced Popsicles                                    All solid food Carbonated beverages, regular and diet                                    Cranberry, grape and apple juices Sports drinks like Gatorade Lightly seasoned clear broth or consume(fat free) Sugar, honey syrup   At 8:00 AM Please finish the prescribed Pre-Surgery Gatorade drink.   Nothing by mouth after you finish the Gatorade drink !     Take these medicines the morning of surgery with A SIP OF WATER: none  Bring mask and tubing to the hospital with you  DO NOT TAKE ANY DIABETIC MEDICATIONS  DAY OF YOUR SURGERY          How to Manage Your Diabetes Before and After Surgery  Why is it important to control my  blood sugar before and after surgery? . Improving blood sugar levels before and after surgery helps healing and can limit problems. . A way of improving blood sugar control is eating a healthy diet by: o  Eating less sugar and carbohydrates o  Increasing activity/exercise o  Talking with your doctor about reaching your blood sugar goals . High blood sugars (greater than 180 mg/dL) can raise your risk of infections and slow your recovery, so you will need to focus on controlling your diabetes during the weeks before surgery. . Make sure that the doctor who takes care of your diabetes knows about your planned surgery including the date and location.  How do I manage my blood sugar before surgery? . Check your blood sugar at least 4 times a day, starting 2 days before surgery, to make sure that the level is not too high or low. o Check your blood sugar the morning of your surgery when you wake up and every 2 hours until you get to the Short Stay unit. . If your blood sugar is less than 70 mg/dL, you will need to treat for low blood sugar: o Do not take insulin. o Treat a low blood sugar (less than 70 mg/dL) with  cup of clear juice (cranberry or apple), 4 glucose tablets, OR glucose gel. o Recheck blood sugar in 15 minutes after treatment (to make sure it is greater than 70 mg/dL). If your blood sugar is not greater than 70 mg/dL on recheck, call [phone] for further instructions. . Report your blood sugar to the short stay nurse when you get to Short Stay.  . If you are admitted to the hospital after surgery: o Your blood sugar will be checked by the staff and you will probably be given insulin after surgery (instead of oral diabetes medicines) to make sure you have good blood sugar levels. o The goal for blood sugar control after surgery is 80-180 mg/dL.   WHAT DO I DO  ABOUT MY DIABETES MEDICATION?  Marland Kitchen Do not take oral diabetes medicines (pills) the morning of surgery.  . The day of surgery, do not take other diabetes injectables, including Byetta (exenatide), Bydureon (exenatide ER), Victoza (liraglutide), or Trulicity (dulaglutide).Ozempic(semaglutide).               You may not have any metal on your body including piercings               Do not wear jewelry, lotions, powders or  deodorant              Men may shave face and neck.   Do not bring valuables to the hospital. [place] IS NOT             RESPONSIBLE   FOR VALUABLES.  Contacts, dentures or bridgework may not be worn into surgery.     Name and phone number of your driver:  Special Instructions: N/A              Please read over the following fact sheets you were given: _____________________________________________________________________             [facility] - Preparing for Surgery  Before surgery, you can play an important role.   Because skin is not sterile, your skin needs to be as free of germs as possible.   You can reduce the number of germs on your skin by washing with CHG (chlorahexidine gluconate) soap before surgery.   CHG is an antiseptic cleaner  which kills germs and bonds with the skin to continue killing germs even after washing. Please DO NOT use if you have an allergy to CHG or antibacterial soaps .  If your skin becomes reddened/irritated stop using the CHG and inform your nurse when you arrive at Short Stay.   You may shave your face/neck.  Please follow these instructions carefully:  1.  Shower with CHG Soap the night before surgery and the  morning of Surgery.  2.  If you choose to wash your hair, wash your hair first as usual with your  normal  shampoo.  3.  After you shampoo, rinse your hair and body thoroughly to remove the  shampoo.                                        4.  Use CHG as you would any other liquid soap.  You can apply chg directly  to  the skin and wash                       Gently with a scrungie or clean washcloth.  5.  Apply the CHG Soap to your body ONLY FROM THE NECK DOWN.   Do not use on face/ open                           Wound or open sores. Avoid contact with eyes, ears mouth and genitals (private parts).                       Wash face,  Genitals (private parts) with your normal soap.             6.  Wash thoroughly, paying special attention to the area where your surgery  will be performed.  7.  Thoroughly rinse your body with warm water from the neck down.  8.  DO NOT shower/wash with your normal soap after using and rinsing off  the CHG Soap.             9.  Pat yourself dry with a clean towel.            10.  Wear clean pajamas.            11.  Place clean sheets on your bed the night of your first shower and do not  sleep with pets. Day of Surgery : Do not apply any lotions/deodorants the morning of surgery.  Please wear clean clothes to the hospital/surgery center.  FAILURE TO FOLLOW THESE INSTRUCTIONS MAY RESULT IN THE CANCELLATION OF YOUR SURGERY PATIENT SIGNATURE_________________________________  NURSE SIGNATURE__________________________________  ________________________________________________________________________   Rogelia MireIncentive Spirometer  An incentive spirometer is a tool that can help keep your lungs clear and active. This tool measures how well you are filling your lungs with each breath. Taking long deep breaths may help reverse or decrease the chance of developing breathing (pulmonary) problems (especially infection) following:  A long period of time when you are unable to move or be active. BEFORE THE PROCEDURE   If the spirometer includes an indicator to show your best effort, your nurse or respiratory therapist will set it to a desired goal.  If possible, sit up straight or lean slightly forward. Try not to slouch.  Hold the incentive spirometer in an upright position. INSTRUCTIONS  FOR USE  1. Sit on the edge of your bed if possible, or sit up as far as you can in bed or on a chair. 2. Hold the incentive spirometer in an upright position. 3. Breathe out normally. 4. Place the mouthpiece in your mouth and seal your lips tightly around it. 5. Breathe in slowly and as deeply as possible, raising the piston or the ball toward the top of the column. 6. Hold your breath for 3-5 seconds or for as long as possible. Allow the piston or ball to fall to the bottom of the column. 7. Remove the mouthpiece from your mouth and breathe out normally. 8. Rest for a few seconds and repeat Steps 1 through 7 at least 10 times every 1-2 hours when you are awake. Take your time and take a few normal breaths between deep breaths. 9. The spirometer may include an indicator to show your best effort. Use the indicator as a goal to work toward during each repetition. 10. After each set of 10 deep breaths, practice coughing to be sure your lungs are clear. If you have an incision (the cut made at the time of surgery), support your incision when coughing by placing a pillow or rolled up towels firmly against it. Once you are able to get out of bed, walk around indoors and cough well. You may stop using the incentive spirometer when instructed by your caregiver.  RISKS AND COMPLICATIONS  Take your time so you do not get dizzy or light-headed.  If you are in pain, you may need to take or ask for pain medication before doing incentive spirometry. It is harder to take a deep breath if you are having pain. AFTER USE  Rest and breathe slowly and easily.  It can be helpful to keep track of a log of your progress. Your caregiver can provide you with a simple table to help with this. If you are using the spirometer at home, follow these instructions: SEEK MEDICAL CARE IF:   You are having difficultly using the spirometer.  You have trouble using the spirometer as often as instructed.  Your pain  medication is not giving enough relief while using the spirometer.  You develop fever of 100.5 F (38.1 C) or higher. SEEK IMMEDIATE MEDICAL CARE IF:   You cough up bloody sputum that had not been present before.  You develop fever of 102 F (38.9 C) or greater.  You develop worsening pain at or near the incision site. MAKE SURE YOU:   Understand these instructions.  Will watch your condition.  Will get help right away if you are not doing well or get worse. Document Released: 03/03/2007 Document Revised: 01/13/2012 Document Reviewed: 05/04/2007 [facility] Patient Information 2014 [place], [place].   ________________________________________________________________________

## 2019-09-07 ENCOUNTER — Other Ambulatory Visit: Payer: Self-pay

## 2019-09-07 ENCOUNTER — Encounter (HOSPITAL_COMMUNITY): Payer: Self-pay

## 2019-09-07 ENCOUNTER — Encounter (HOSPITAL_COMMUNITY)
Admission: RE | Admit: 2019-09-07 | Discharge: 2019-09-07 | Disposition: A | Discharge status: Home / Self Care | Payer: Medicare HMO | Source: Ambulatory Visit | Attending: Orthopaedic Surgery | Admitting: Orthopaedic Surgery

## 2019-09-07 ENCOUNTER — Other Ambulatory Visit (HOSPITAL_COMMUNITY)
Admission: RE | Admit: 2019-09-07 | Discharge: 2019-09-07 | Disposition: A | Discharge status: Home / Self Care | Payer: Medicare HMO | Source: Ambulatory Visit | Attending: Orthopaedic Surgery | Admitting: Orthopaedic Surgery

## 2019-09-07 DIAGNOSIS — E1142 Type 2 diabetes mellitus with diabetic polyneuropathy: Secondary | ICD-10-CM | POA: Insufficient documentation

## 2019-09-07 DIAGNOSIS — I10 Essential (primary) hypertension: Secondary | ICD-10-CM | POA: Insufficient documentation

## 2019-09-07 DIAGNOSIS — Z79899 Other long term (current) drug therapy: Secondary | ICD-10-CM | POA: Insufficient documentation

## 2019-09-07 DIAGNOSIS — G4733 Obstructive sleep apnea (adult) (pediatric): Secondary | ICD-10-CM | POA: Insufficient documentation

## 2019-09-07 DIAGNOSIS — M1612 Unilateral primary osteoarthritis, left hip: Secondary | ICD-10-CM | POA: Insufficient documentation

## 2019-09-07 DIAGNOSIS — E785 Hyperlipidemia, unspecified: Secondary | ICD-10-CM | POA: Diagnosis not present

## 2019-09-07 DIAGNOSIS — Z01812 Encounter for preprocedural laboratory examination: Secondary | ICD-10-CM | POA: Insufficient documentation

## 2019-09-07 DIAGNOSIS — Z7984 Long term (current) use of oral hypoglycemic drugs: Secondary | ICD-10-CM | POA: Diagnosis not present

## 2019-09-07 HISTORY — DX: Personal history of urinary calculi: Z87.442

## 2019-09-07 LAB — CBC
HCT: 48.9 % (ref 39.0–52.0)
Hemoglobin: 16.9 g/dL (ref 13.0–17.0)
MCH: 30.3 pg (ref 26.0–34.0)
MCHC: 34.6 g/dL (ref 30.0–36.0)
MCV: 87.6 fL (ref 80.0–100.0)
Platelets: 142 10*3/uL — ABNORMAL LOW (ref 150–400)
RBC: 5.58 MIL/uL (ref 4.22–5.81)
RDW: 13.1 % (ref 11.5–15.5)
WBC: 5.5 10*3/uL (ref 4.0–10.5)
nRBC: 0 % (ref 0.0–0.2)

## 2019-09-07 LAB — BASIC METABOLIC PANEL
Anion gap: 12 (ref 5–15)
BUN: 21 mg/dL — ABNORMAL HIGH (ref 6–20)
CO2: 24 mmol/L (ref 22–32)
Calcium: 10 mg/dL (ref 8.9–10.3)
Chloride: 102 mmol/L (ref 98–111)
Creatinine, Ser: 0.65 mg/dL (ref 0.61–1.24)
GFR calc Af Amer: >60 mL/min (ref >60)
GFR calc non Af Amer: >60 mL/min (ref >60)
Glucose, Bld: 158 mg/dL — ABNORMAL HIGH (ref 70–99)
Potassium: 4.2 mmol/L (ref 3.5–5.1)
Sodium: 138 mmol/L (ref 135–145)

## 2019-09-07 LAB — SURGICAL PCR SCREEN
MRSA, PCR: NEGATIVE
Staphylococcus aureus: NEGATIVE

## 2019-09-07 LAB — HEMOGLOBIN A1C
Hgb A1c MFr Bld: 6.7 % — ABNORMAL HIGH (ref 4.8–5.6)
Mean Plasma Glucose: 145.59 mg/dL

## 2019-09-07 LAB — GLUCOSE, CAPILLARY: Glucose-Capillary: 160 mg/dL — ABNORMAL HIGH (ref 70–99)

## 2019-09-07 NOTE — Progress Notes (Signed)
PCP - Dr. Hoover Brunette Cardiologist - no  Chest x-ray - 2008 EKG - 09/07/19. Old EKG requested from PCP 09/07/19 Stress Test - no ECHO - no Cardiac Cath - no  Sleep Study - yes CPAP - yes  Fasting Blood Sugar - 160-220 Checks Blood Sugar _____ times a day 5 times a week. Pt told to monitor blood sugars and call MD if they are trending high to prevent the cancellation of surgery  Blood Thinner Instructions:NA Aspirin Instructions: Last Dose:  Anesthesia review:   Patient denies shortness of breath, fever, cough and chest pain at PAT appointment yes  Patient verbalized understanding of instructions that were given to them at the PAT appointment. Patient was also instructed that they will need to review over the PAT instructions again at home before surgery. yes

## 2019-09-08 LAB — NOVEL CORONAVIRUS, NAA (HOSP ORDER, SEND-OUT TO REF LAB; TAT 18-24 HRS): SARS-CoV-2, NAA: NOT DETECTED

## 2019-09-09 MED ORDER — DEXTROSE 5 % IV SOLN
3.0000 g | INTRAVENOUS | Status: AC
  Administered 2019-09-10: 11:00:00 3 g via INTRAVENOUS
  Filled 2019-09-09: qty 3

## 2019-09-09 NOTE — H&P (Signed)
TOTAL HIP ADMISSION H&P  Patient is admitted for left total hip arthroplasty.  Subjective:  Chief Complaint: left hip pain  HPI: Joseph Hinton, 54 y.o. male, has a history of pain and functional disability in the left hip(s) due to arthritis and patient has failed non-surgical conservative treatments for greater than 12 weeks to include NSAID's and/or analgesics, corticosteriod injections, flexibility and strengthening excercises, use of assistive devices, weight reduction as appropriate and activity modification.  Onset of symptoms was gradual starting 4 years ago with gradually worsening course since that time.The patient noted no past surgery on the right hip(s).  Patient currently rates pain in the right hip at 10 out of 10 with activity. Patient has night pain, worsening of pain with activity and weight bearing, trendelenberg gait, pain that interfers with activities of daily living and pain with passive range of motion. Patient has evidence of subchondral cysts, subchondral sclerosis, periarticular osteophytes and joint space narrowing by imaging studies. This condition presents safety issues increasing the risk of falls.  There is no current active infection.  Patient Active Problem List   Diagnosis Date Noted  . Unilateral primary osteoarthritis, left hip 07/14/2019  . DIABETES MELLITUS, TYPE II, UNCONTROLLED 09/29/2007  . DIABETIC PERIPHERAL NEUROPATHY 09/28/2007  . HYPERLIPIDEMIA 09/28/2007  . OBESITY 09/28/2007  . NICOTINE ADDICTION 09/28/2007  . OBSTRUCTIVE SLEEP APNEA 09/28/2007  . CATARACTS 09/28/2007  . HYPERTENSION 09/28/2007  . OSTEOARTHRITIS 09/28/2007  . LOW BACK PAIN 09/28/2007  . FOOT PAIN, LEFT 09/28/2007   Past Medical History:  Diagnosis Date  . Diabetes mellitus without complication ([place])   . Foot infection   . History of kidney stones   . Hyperlipidemia   . Hypertension   . Neuropathy   . OA (osteoarthritis of spine)   . OSA on CPAP   . PVC's (premature  ventricular contractions)   . Retinal hemorrhage, right     Past Surgical History:  Procedure Laterality Date  . TONSILLECTOMY AND ADENOIDECTOMY    . uvulaectomy      Current Facility-Administered Medications  Medication Dose Route Frequency Provider Last Rate Last Dose  . [START ON 09/10/2019] ceFAZolin (ANCEF) 3 g in dextrose 5 % 50 mL IVPB  3 g Intravenous On Call to Alcan Border, MD       Current Outpatient Medications  Medication Sig Dispense Refill Last Dose  . atorvastatin (LIPITOR) 10 MG tablet Take 10 mg by mouth daily.     . cyclobenzaprine (FLEXERIL) 10 MG tablet Take 10 mg by mouth 3 (three) times daily as needed for muscle spasms.     . enalapril (VASOTEC) 10 MG tablet Take 10 mg by mouth daily.     Marland Kitchen gabapentin (NEURONTIN) 600 MG tablet Take 600 mg by mouth 3 (three) times daily.     Marland Kitchen ibuprofen (ADVIL) 600 MG tablet Take 600 mg by mouth 3 (three) times daily.     Marland Kitchen JARDIANCE 10 MG TABS tablet Take 10 mg by mouth daily.     . metFORMIN (GLUCOPHAGE) 1000 MG tablet Take 1,000 mg by mouth 2 (two) times daily.     Marland Kitchen OZEMPIC, 1 MG/DOSE, 2 MG/1.5ML SOPN Inject 1 mg into the skin every Wednesday.     Marland Kitchen ACCU-CHEK AVIVA PLUS test strip USE AS DIRECTED TO CHECK BLOOD SUGARS TWICE DAILY 100 strip 2    No Known Allergies  Social History   Tobacco Use  . Smoking status: Former Research scientist (life sciences)  . Smokeless tobacco: Never Used  Substance  Use Topics  . Alcohol use: No    Comment: quit in 1997    Family History  Problem Relation Age of Onset  . Stroke Mother   . Diabetes Mother   . Hypertension Mother   . Nephrolithiasis Brother   . Lung cancer Father      Review of Systems  Musculoskeletal: Positive for joint pain.  All other systems reviewed and are negative.   Objective:  Physical Exam  Constitutional: He is oriented to person, place, and time. He appears well-developed and well-nourished.  HENT:  Head: Normocephalic and atraumatic.  Eyes: Pupils are equal,  round, and reactive to light. EOM are normal.  Neck: Normal range of motion. Neck supple.  Cardiovascular: Normal rate and regular rhythm.  Respiratory: Effort normal and breath sounds normal.  GI: Soft.  Musculoskeletal:     Left hip: He exhibits decreased range of motion, decreased strength, tenderness and bony tenderness.  Neurological: He is alert and oriented to person, place, and time.  Skin: Skin is warm and dry.  Psychiatric: He has a normal mood and affect.    Vital signs in last 24 hours:    Labs:   Estimated body mass index is 39.58 kg/m as calculated from the following:   Height as of 09/07/19: 5\' 11"  (1.803 m).   Weight as of 09/07/19: 128.7 kg.   Imaging Review Plain radiographs demonstrate severe degenerative joint disease of the left hip(s). The bone quality appears to be excellent for age and reported activity level.      Assessment/Plan:  End stage arthritis, left hip(s)  The patient history, physical examination, clinical judgement of the provider and imaging studies are consistent with end stage degenerative joint disease of the left hip(s) and total hip arthroplasty is deemed medically necessary. The treatment options including medical management, injection therapy, arthroscopy and arthroplasty were discussed at length. The risks and benefits of total hip arthroplasty were presented and reviewed. The risks due to aseptic loosening, infection, stiffness, dislocation/subluxation,  thromboembolic complications and other imponderables were discussed.  The patient acknowledged the explanation, agreed to proceed with the plan and consent was signed. Patient is being admitted for inpatient treatment for surgery, pain control, PT, OT, prophylactic antibiotics, VTE prophylaxis, progressive ambulation and ADL's and discharge planning.The patient is planning to be discharged home with home health services

## 2019-09-10 ENCOUNTER — Ambulatory Visit (HOSPITAL_COMMUNITY): Payer: Medicare HMO | Admitting: Certified Registered"

## 2019-09-10 ENCOUNTER — Encounter (HOSPITAL_COMMUNITY): Payer: Self-pay | Admitting: *Deleted

## 2019-09-10 ENCOUNTER — Observation Stay (HOSPITAL_COMMUNITY)
Admission: RE | Admit: 2019-09-10 | Discharge: 2019-09-12 | Disposition: A | Discharge status: Home Health | Payer: Medicare HMO | Attending: Orthopaedic Surgery | Admitting: Orthopaedic Surgery

## 2019-09-10 ENCOUNTER — Ambulatory Visit (HOSPITAL_COMMUNITY): Payer: Medicare HMO

## 2019-09-10 ENCOUNTER — Encounter (HOSPITAL_COMMUNITY)
Admission: RE | Disposition: A | Discharge status: Home Health | Payer: Self-pay | Source: Home / Self Care | Attending: Orthopaedic Surgery

## 2019-09-10 ENCOUNTER — Observation Stay (HOSPITAL_COMMUNITY): Payer: Medicare HMO

## 2019-09-10 ENCOUNTER — Other Ambulatory Visit: Payer: Self-pay

## 2019-09-10 ENCOUNTER — Ambulatory Visit (HOSPITAL_COMMUNITY): Payer: Medicare HMO | Admitting: Physician Assistant

## 2019-09-10 DIAGNOSIS — Z6839 Body mass index (BMI) 39.0-39.9, adult: Secondary | ICD-10-CM | POA: Diagnosis not present

## 2019-09-10 DIAGNOSIS — E1136 Type 2 diabetes mellitus with diabetic cataract: Secondary | ICD-10-CM | POA: Diagnosis not present

## 2019-09-10 DIAGNOSIS — Z79899 Other long term (current) drug therapy: Secondary | ICD-10-CM | POA: Diagnosis not present

## 2019-09-10 DIAGNOSIS — G4733 Obstructive sleep apnea (adult) (pediatric): Secondary | ICD-10-CM | POA: Diagnosis not present

## 2019-09-10 DIAGNOSIS — M1612 Unilateral primary osteoarthritis, left hip: Secondary | ICD-10-CM | POA: Diagnosis not present

## 2019-09-10 DIAGNOSIS — Z7984 Long term (current) use of oral hypoglycemic drugs: Secondary | ICD-10-CM | POA: Diagnosis not present

## 2019-09-10 DIAGNOSIS — Z87891 Personal history of nicotine dependence: Secondary | ICD-10-CM | POA: Insufficient documentation

## 2019-09-10 DIAGNOSIS — Z419 Encounter for procedure for purposes other than remedying health state, unspecified: Secondary | ICD-10-CM

## 2019-09-10 DIAGNOSIS — Z96642 Presence of left artificial hip joint: Secondary | ICD-10-CM | POA: Diagnosis not present

## 2019-09-10 DIAGNOSIS — M479 Spondylosis, unspecified: Secondary | ICD-10-CM | POA: Diagnosis not present

## 2019-09-10 DIAGNOSIS — Z791 Long term (current) use of non-steroidal anti-inflammatories (NSAID): Secondary | ICD-10-CM | POA: Diagnosis not present

## 2019-09-10 DIAGNOSIS — M1611 Unilateral primary osteoarthritis, right hip: Secondary | ICD-10-CM | POA: Diagnosis not present

## 2019-09-10 DIAGNOSIS — Z833 Family history of diabetes mellitus: Secondary | ICD-10-CM | POA: Diagnosis not present

## 2019-09-10 DIAGNOSIS — Z8249 Family history of ischemic heart disease and other diseases of the circulatory system: Secondary | ICD-10-CM | POA: Diagnosis not present

## 2019-09-10 DIAGNOSIS — E1142 Type 2 diabetes mellitus with diabetic polyneuropathy: Secondary | ICD-10-CM | POA: Insufficient documentation

## 2019-09-10 DIAGNOSIS — E785 Hyperlipidemia, unspecified: Secondary | ICD-10-CM | POA: Insufficient documentation

## 2019-09-10 DIAGNOSIS — E119 Type 2 diabetes mellitus without complications: Secondary | ICD-10-CM | POA: Diagnosis not present

## 2019-09-10 DIAGNOSIS — I10 Essential (primary) hypertension: Secondary | ICD-10-CM | POA: Diagnosis not present

## 2019-09-10 DIAGNOSIS — Z471 Aftercare following joint replacement surgery: Secondary | ICD-10-CM | POA: Diagnosis not present

## 2019-09-10 HISTORY — PX: TOTAL HIP ARTHROPLASTY: SHX124

## 2019-09-10 LAB — GLUCOSE, CAPILLARY
Glucose-Capillary: 158 mg/dL — ABNORMAL HIGH (ref 70–99)
Glucose-Capillary: 344 mg/dL — ABNORMAL HIGH (ref 70–99)
Glucose-Capillary: 342 mg/dL — ABNORMAL HIGH (ref 70–99)
Glucose-Capillary: 286 mg/dL — ABNORMAL HIGH (ref 70–99)
Glucose-Capillary: 283 mg/dL — ABNORMAL HIGH (ref 70–99)
Glucose-Capillary: 205 mg/dL — ABNORMAL HIGH (ref 70–99)

## 2019-09-10 SURGERY — ARTHROPLASTY, HIP, TOTAL, ANTERIOR APPROACH
Anesthesia: General | Site: Hip | Laterality: Left

## 2019-09-10 MED ORDER — CHLORHEXIDINE GLUCONATE 4 % EX LIQD: 60.0000 mL | Freq: Once | CUTANEOUS | Status: DC

## 2019-09-10 MED ORDER — INSULIN ASPART 100 UNIT/ML ~~LOC~~ SOLN
0.0000 [IU] | Freq: Three times a day (TID) | SUBCUTANEOUS | Status: DC
  Administered 2019-09-10: 11 [IU] via SUBCUTANEOUS
  Administered 2019-09-11: 3 [IU] via SUBCUTANEOUS
  Administered 2019-09-11: 7 [IU] via SUBCUTANEOUS
  Administered 2019-09-12: 3 [IU] via SUBCUTANEOUS
  Administered 2019-09-12 (×2): 4 [IU] via SUBCUTANEOUS

## 2019-09-10 MED ORDER — OXYCODONE HCL 5 MG PO TABS
5.0000 mg | ORAL_TABLET | Freq: Once | ORAL | Status: AC | PRN
  Administered 2019-09-10: 5 mg via ORAL

## 2019-09-10 MED ORDER — PROPOFOL 500 MG/50ML IV EMUL
INTRAVENOUS | Status: AC
  Filled 2019-09-10: qty 100

## 2019-09-10 MED ORDER — METOCLOPRAMIDE HCL 5 MG PO TABS: 5.0000 mg | ORAL_TABLET | Freq: Three times a day (TID) | ORAL | Status: DC | PRN

## 2019-09-10 MED ORDER — FENTANYL CITRATE (PF) 100 MCG/2ML IJ SOLN
INTRAMUSCULAR | Status: AC
  Filled 2019-09-10: qty 2

## 2019-09-10 MED ORDER — POLYETHYLENE GLYCOL 3350 17 G PO PACK: 17.0000 g | PACK | Freq: Every day | ORAL | Status: DC | PRN

## 2019-09-10 MED ORDER — ONDANSETRON HCL 4 MG PO TABS
4.0000 mg | ORAL_TABLET | Freq: Four times a day (QID) | ORAL | Status: DC | PRN
  Administered 2019-09-12: 4 mg via ORAL
  Filled 2019-09-10: qty 1

## 2019-09-10 MED ORDER — CANAGLIFLOZIN 100 MG PO TABS
100.0000 mg | ORAL_TABLET | Freq: Every day | ORAL | Status: DC
  Administered 2019-09-11 – 2019-09-12 (×2): 100 mg via ORAL
  Filled 2019-09-10 (×3): qty 1

## 2019-09-10 MED ORDER — TRANEXAMIC ACID-NACL 1000-0.7 MG/100ML-% IV SOLN
1000.0000 mg | INTRAVENOUS | Status: DC
  Filled 2019-09-10: qty 100

## 2019-09-10 MED ORDER — HYDROMORPHONE HCL 1 MG/ML IJ SOLN
INTRAMUSCULAR | Status: AC
  Filled 2019-09-10: qty 1

## 2019-09-10 MED ORDER — ENALAPRIL MALEATE 10 MG PO TABS
10.0000 mg | ORAL_TABLET | Freq: Every day | ORAL | Status: DC
  Administered 2019-09-10 – 2019-09-12 (×3): 10 mg via ORAL
  Filled 2019-09-10 (×3): qty 1

## 2019-09-10 MED ORDER — FENTANYL CITRATE (PF) 100 MCG/2ML IJ SOLN
25.0000 ug | INTRAMUSCULAR | Status: DC | PRN
  Administered 2019-09-10 (×3): 50 ug via INTRAVENOUS

## 2019-09-10 MED ORDER — ESMOLOL HCL 100 MG/10ML IV SOLN
INTRAVENOUS | Status: AC
  Filled 2019-09-10: qty 10

## 2019-09-10 MED ORDER — HYDROMORPHONE HCL 1 MG/ML IJ SOLN
0.5000 mg | INTRAMUSCULAR | Status: DC | PRN
  Administered 2019-09-10 (×2): 0.5 mg via INTRAVENOUS

## 2019-09-10 MED ORDER — ATORVASTATIN CALCIUM 10 MG PO TABS
10.0000 mg | ORAL_TABLET | Freq: Every day | ORAL | Status: DC
  Administered 2019-09-10 – 2019-09-12 (×3): 10 mg via ORAL
  Filled 2019-09-10 (×3): qty 1

## 2019-09-10 MED ORDER — ONDANSETRON HCL 4 MG/2ML IJ SOLN
INTRAMUSCULAR | Status: AC
  Filled 2019-09-10: qty 2

## 2019-09-10 MED ORDER — ONDANSETRON HCL 4 MG/2ML IJ SOLN: 4.0000 mg | Freq: Four times a day (QID) | INTRAMUSCULAR | Status: DC | PRN

## 2019-09-10 MED ORDER — SUCCINYLCHOLINE CHLORIDE 20 MG/ML IJ SOLN
INTRAMUSCULAR | Status: DC | PRN
  Administered 2019-09-10: 140 mg via INTRAVENOUS

## 2019-09-10 MED ORDER — INSULIN ASPART 100 UNIT/ML ~~LOC~~ SOLN
SUBCUTANEOUS | Status: AC
  Filled 2019-09-10: qty 1

## 2019-09-10 MED ORDER — MIDAZOLAM HCL 5 MG/5ML IJ SOLN
INTRAMUSCULAR | Status: DC | PRN
  Administered 2019-09-10: 2 mg via INTRAVENOUS

## 2019-09-10 MED ORDER — POVIDONE-IODINE 10 % EX SWAB: 2.0000 "application " | Freq: Once | CUTANEOUS | Status: DC

## 2019-09-10 MED ORDER — PROPOFOL 500 MG/50ML IV EMUL
INTRAVENOUS | Status: DC | PRN
  Administered 2019-09-10: 25 ug/kg/min via INTRAVENOUS

## 2019-09-10 MED ORDER — METHOCARBAMOL 500 MG IVPB - SIMPLE MED
500.0000 mg | Freq: Four times a day (QID) | INTRAVENOUS | Status: DC | PRN
  Administered 2019-09-10: 500 mg via INTRAVENOUS
  Filled 2019-09-10: qty 50

## 2019-09-10 MED ORDER — FENTANYL CITRATE (PF) 250 MCG/5ML IJ SOLN
INTRAMUSCULAR | Status: AC
  Filled 2019-09-10: qty 5

## 2019-09-10 MED ORDER — DOCUSATE SODIUM 100 MG PO CAPS
100.0000 mg | ORAL_CAPSULE | Freq: Two times a day (BID) | ORAL | Status: DC
  Administered 2019-09-10 – 2019-09-12 (×5): 100 mg via ORAL
  Filled 2019-09-10 (×5): qty 1

## 2019-09-10 MED ORDER — INSULIN ASPART 100 UNIT/ML ~~LOC~~ SOLN
15.0000 [IU] | Freq: Once | SUBCUTANEOUS | Status: AC
  Administered 2019-09-10: 15 [IU] via SUBCUTANEOUS

## 2019-09-10 MED ORDER — ESMOLOL HCL 100 MG/10ML IV SOLN
INTRAVENOUS | Status: DC | PRN
  Administered 2019-09-10: 40 mg via INTRAVENOUS

## 2019-09-10 MED ORDER — OXYCODONE HCL 5 MG PO TABS
ORAL_TABLET | ORAL | Status: AC
  Administered 2019-09-10: 10 mg via ORAL
  Filled 2019-09-10: qty 1

## 2019-09-10 MED ORDER — METHOCARBAMOL 500 MG PO TABS
500.0000 mg | ORAL_TABLET | Freq: Four times a day (QID) | ORAL | Status: DC | PRN
  Administered 2019-09-10 – 2019-09-12 (×4): 500 mg via ORAL
  Filled 2019-09-10 (×5): qty 1

## 2019-09-10 MED ORDER — FENTANYL CITRATE (PF) 250 MCG/5ML IJ SOLN
INTRAMUSCULAR | Status: DC | PRN
  Administered 2019-09-10 (×2): 50 ug via INTRAVENOUS
  Administered 2019-09-10: 100 ug via INTRAVENOUS
  Administered 2019-09-10: 50 ug via INTRAVENOUS
  Administered 2019-09-10: 100 ug via INTRAVENOUS
  Administered 2019-09-10: 50 ug via INTRAVENOUS
  Administered 2019-09-10: 100 ug via INTRAVENOUS

## 2019-09-10 MED ORDER — MENTHOL 3 MG MT LOZG: 1.0000 | LOZENGE | OROMUCOSAL | Status: DC | PRN

## 2019-09-10 MED ORDER — METFORMIN HCL 500 MG PO TABS
1000.0000 mg | ORAL_TABLET | Freq: Two times a day (BID) | ORAL | Status: DC
  Administered 2019-09-10 – 2019-09-12 (×5): 1000 mg via ORAL
  Filled 2019-09-10 (×5): qty 2

## 2019-09-10 MED ORDER — OXYCODONE HCL 5 MG/5ML PO SOLN: 5.0000 mg | Freq: Once | ORAL | Status: AC | PRN

## 2019-09-10 MED ORDER — STERILE WATER FOR IRRIGATION IR SOLN
Status: DC | PRN
  Administered 2019-09-10: 2000 mL

## 2019-09-10 MED ORDER — DEXMEDETOMIDINE HCL IN NACL 400 MCG/100ML IV SOLN
INTRAVENOUS | Status: DC | PRN
  Administered 2019-09-10: 0.4 ug/kg/h via INTRAVENOUS

## 2019-09-10 MED ORDER — HYDROMORPHONE HCL 1 MG/ML IJ SOLN: 0.5000 mg | INTRAMUSCULAR | Status: DC | PRN

## 2019-09-10 MED ORDER — METOCLOPRAMIDE HCL 5 MG/ML IJ SOLN: 5.0000 mg | Freq: Three times a day (TID) | INTRAMUSCULAR | Status: DC | PRN

## 2019-09-10 MED ORDER — LABETALOL HCL 5 MG/ML IV SOLN
INTRAVENOUS | Status: AC
  Filled 2019-09-10: qty 8

## 2019-09-10 MED ORDER — GABAPENTIN 300 MG PO CAPS
600.0000 mg | ORAL_CAPSULE | Freq: Three times a day (TID) | ORAL | Status: DC
  Administered 2019-09-10 – 2019-09-12 (×7): 600 mg via ORAL
  Filled 2019-09-10 (×7): qty 2

## 2019-09-10 MED ORDER — METHOCARBAMOL 500 MG IVPB - SIMPLE MED
INTRAVENOUS | Status: AC
  Filled 2019-09-10: qty 50

## 2019-09-10 MED ORDER — INSULIN ASPART 100 UNIT/ML ~~LOC~~ SOLN
0.0000 [IU] | Freq: Every day | SUBCUTANEOUS | Status: DC
  Administered 2019-09-10: 2 [IU] via SUBCUTANEOUS

## 2019-09-10 MED ORDER — OXYCODONE HCL 5 MG PO TABS
10.0000 mg | ORAL_TABLET | ORAL | Status: DC | PRN
  Administered 2019-09-11 (×2): 15 mg via ORAL
  Administered 2019-09-11: 10 mg via ORAL
  Administered 2019-09-12: 15 mg via ORAL
  Administered 2019-09-12: 10 mg via ORAL
  Administered 2019-09-12: 15 mg via ORAL
  Filled 2019-09-10 (×5): qty 3

## 2019-09-10 MED ORDER — ALUM & MAG HYDROXIDE-SIMETH 200-200-20 MG/5ML PO SUSP: 30.0000 mL | ORAL | Status: DC | PRN

## 2019-09-10 MED ORDER — ACETAMINOPHEN 325 MG PO TABS
325.0000 mg | ORAL_TABLET | Freq: Four times a day (QID) | ORAL | Status: DC | PRN
  Administered 2019-09-11: 650 mg via ORAL
  Filled 2019-09-10: qty 2

## 2019-09-10 MED ORDER — CEFAZOLIN SODIUM-DEXTROSE 2-4 GM/100ML-% IV SOLN
2.0000 g | Freq: Four times a day (QID) | INTRAVENOUS | Status: AC
  Administered 2019-09-10 (×2): 2 g via INTRAVENOUS
  Filled 2019-09-10 (×2): qty 100

## 2019-09-10 MED ORDER — PROPOFOL 10 MG/ML IV BOLUS
INTRAVENOUS | Status: DC | PRN
  Administered 2019-09-10: 30 ug via INTRAVENOUS
  Administered 2019-09-10: 50 ug via INTRAVENOUS
  Administered 2019-09-10: 200 ug via INTRAVENOUS

## 2019-09-10 MED ORDER — MIDAZOLAM HCL 2 MG/2ML IJ SOLN
INTRAMUSCULAR | Status: AC
  Filled 2019-09-10: qty 2

## 2019-09-10 MED ORDER — ASPIRIN 81 MG PO CHEW
81.0000 mg | CHEWABLE_TABLET | Freq: Two times a day (BID) | ORAL | Status: DC
  Administered 2019-09-10 – 2019-09-12 (×4): 81 mg via ORAL
  Filled 2019-09-10 (×4): qty 1

## 2019-09-10 MED ORDER — SODIUM CHLORIDE 0.9 % IV SOLN
INTRAVENOUS | Status: DC
  Administered 2019-09-10: 75 mL/h via INTRAVENOUS
  Administered 2019-09-11: 06:00:00 via INTRAVENOUS

## 2019-09-10 MED ORDER — INSULIN ASPART 100 UNIT/ML ~~LOC~~ SOLN
0.0000 [IU] | Freq: Three times a day (TID) | SUBCUTANEOUS | Status: DC
  Administered 2019-09-10: 14:00:00 15 [IU] via SUBCUTANEOUS

## 2019-09-10 MED ORDER — LIDOCAINE 2% (20 MG/ML) 5 ML SYRINGE
INTRAMUSCULAR | Status: AC
  Filled 2019-09-10: qty 5

## 2019-09-10 MED ORDER — DIPHENHYDRAMINE HCL 12.5 MG/5ML PO ELIX: 12.5000 mg | ORAL_SOLUTION | ORAL | Status: DC | PRN

## 2019-09-10 MED ORDER — OXYCODONE HCL 5 MG PO TABS
5.0000 mg | ORAL_TABLET | ORAL | Status: DC | PRN
  Administered 2019-09-10: 19:00:00 10 mg via ORAL
  Filled 2019-09-10 (×3): qty 2

## 2019-09-10 MED ORDER — 0.9 % SODIUM CHLORIDE (POUR BTL) OPTIME
TOPICAL | Status: DC | PRN
  Administered 2019-09-10: 1000 mL

## 2019-09-10 MED ORDER — SODIUM CHLORIDE 0.9 % IR SOLN
Status: DC | PRN
  Administered 2019-09-10: 1000 mL

## 2019-09-10 MED ORDER — LABETALOL HCL 5 MG/ML IV SOLN
INTRAVENOUS | Status: DC | PRN
  Administered 2019-09-10 (×3): 5 mg via INTRAVENOUS
  Administered 2019-09-10: 10 mg via INTRAVENOUS
  Administered 2019-09-10: 5 mg via INTRAVENOUS

## 2019-09-10 MED ORDER — SUCCINYLCHOLINE CHLORIDE 200 MG/10ML IV SOSY
PREFILLED_SYRINGE | INTRAVENOUS | Status: AC
  Filled 2019-09-10: qty 10

## 2019-09-10 MED ORDER — HYDRALAZINE HCL 20 MG/ML IJ SOLN
INTRAMUSCULAR | Status: DC | PRN
  Administered 2019-09-10: 10 mg via INTRAVENOUS

## 2019-09-10 MED ORDER — HYDRALAZINE HCL 20 MG/ML IJ SOLN
INTRAMUSCULAR | Status: AC
  Filled 2019-09-10: qty 1

## 2019-09-10 MED ORDER — PHENOL 1.4 % MT LIQD: 1.0000 | OROMUCOSAL | Status: DC | PRN

## 2019-09-10 MED ORDER — PANTOPRAZOLE SODIUM 40 MG PO TBEC
40.0000 mg | DELAYED_RELEASE_TABLET | Freq: Every day | ORAL | Status: DC
  Administered 2019-09-10 – 2019-09-12 (×3): 40 mg via ORAL
  Filled 2019-09-10 (×3): qty 1

## 2019-09-10 MED ORDER — LIDOCAINE 2% (20 MG/ML) 5 ML SYRINGE
INTRAMUSCULAR | Status: DC | PRN
  Administered 2019-09-10: 60 mg via INTRAVENOUS

## 2019-09-10 MED ORDER — LACTATED RINGERS IV SOLN
INTRAVENOUS | Status: DC
  Administered 2019-09-10 (×3): via INTRAVENOUS

## 2019-09-10 SURGICAL SUPPLY — 40 items
APL SKNCLS STERI-STRIP NONHPOA (GAUZE/BANDAGES/DRESSINGS)
BAG SPEC THK2 15X12 ZIP CLS (MISCELLANEOUS)
BAG ZIPLOCK 12X15 (MISCELLANEOUS) IMPLANT
BENZOIN TINCTURE PRP APPL 2/3 (GAUZE/BANDAGES/DRESSINGS) IMPLANT
BLADE SAW SGTL 18X1.27X75 (BLADE) ×2 IMPLANT
COVER PERINEAL POST (MISCELLANEOUS) ×2 IMPLANT
COVER SURGICAL LIGHT HANDLE (MISCELLANEOUS) ×2 IMPLANT
COVER WAND RF STERILE (DRAPES) ×2 IMPLANT
DRAPE STERI IOBAN 125X83 (DRAPES) ×2 IMPLANT
DRAPE U-SHAPE 47X51 STRL (DRAPES) ×4 IMPLANT
DRSG AQUACEL AG ADV 3.5X10 (GAUZE/BANDAGES/DRESSINGS) ×2 IMPLANT
DURAPREP 26ML APPLICATOR (WOUND CARE) ×2 IMPLANT
ELECT REM PT RETURN 15FT ADLT (MISCELLANEOUS) ×2 IMPLANT
GAUZE XEROFORM 1X8 LF (GAUZE/BANDAGES/DRESSINGS) ×2 IMPLANT
GLOVE BIO SURGEON STRL SZ7.5 (GLOVE) ×2 IMPLANT
GLOVE BIOGEL PI IND STRL 8 (GLOVE) ×2 IMPLANT
GLOVE BIOGEL PI INDICATOR 8 (GLOVE) ×2
GLOVE ECLIPSE 8.0 STRL XLNG CF (GLOVE) ×2 IMPLANT
GOWN STRL REUS W/TWL XL LVL3 (GOWN DISPOSABLE) ×4 IMPLANT
HANDPIECE INTERPULSE COAX TIP (DISPOSABLE) ×2
HEAD CERAMIC 36 PLUS5 (Hips) ×1 IMPLANT
HOLDER FOLEY CATH W/STRAP (MISCELLANEOUS) ×1 IMPLANT
KIT TURNOVER KIT A (KITS) IMPLANT
LINER NEUTRAL 52X36MM PLUS 4 (Liner) ×1 IMPLANT
PACK ANTERIOR HIP CUSTOM (KITS) ×2 IMPLANT
PIN SECTOR W/GRIP ACE CUP 52MM (Hips) ×1 IMPLANT
SCREW 6.5MMX25MM (Screw) ×1 IMPLANT
SET HNDPC FAN SPRY TIP SCT (DISPOSABLE) ×1 IMPLANT
STAPLER VISISTAT 35W (STAPLE) ×1 IMPLANT
STEM FEM ACTIS STD SZ7 (Nail) ×1 IMPLANT
STRIP CLOSURE SKIN 1/2X4 (GAUZE/BANDAGES/DRESSINGS) IMPLANT
SUT ETHIBOND NAB CT1 #1 30IN (SUTURE) ×2 IMPLANT
SUT ETHILON 2 0 PS N (SUTURE) IMPLANT
SUT MNCRL AB 4-0 PS2 18 (SUTURE) IMPLANT
SUT VIC AB 0 CT1 36 (SUTURE) ×2 IMPLANT
SUT VIC AB 1 CT1 36 (SUTURE) ×2 IMPLANT
SUT VIC AB 2-0 CT1 27 (SUTURE) ×4
SUT VIC AB 2-0 CT1 TAPERPNT 27 (SUTURE) ×2 IMPLANT
TRAY FOLEY MTR SLVR 16FR STAT (SET/KITS/TRAYS/PACK) IMPLANT
YANKAUER SUCT BULB TIP 10FT TU (MISCELLANEOUS) ×2 IMPLANT

## 2019-09-10 NOTE — Anesthesia Procedure Notes (Signed)
Procedure Name: Intubation Date/Time: 09/10/2019 11:25 AM Performed by: Lieutenant Diego, CRNA Pre-anesthesia Checklist: Patient identified, Emergency Drugs available, Suction available and Patient being monitored Patient Re-evaluated:Patient Re-evaluated prior to induction Oxygen Delivery Method: Circle system utilized Preoxygenation: Pre-oxygenation with 100% oxygen Induction Type: IV induction Ventilation: Two handed mask ventilation required and Oral airway inserted - appropriate to patient size Laryngoscope Size: Miller, 2 and Glidescope Grade View: Grade III Tube type: Oral Tube size: 7.5 mm Number of attempts: 2 Airway Equipment and Method: Stylet,  Oral airway and Video-laryngoscopy Placement Confirmation: ETT inserted through vocal cords under direct vision,  positive ETCO2 and breath sounds checked- equal and bilateral Secured at: 24 cm Tube secured with: Tape Dental Injury: Teeth and Oropharynx as per pre-operative assessment  Difficulty Due To: Difficulty was anticipated, Difficult Airway- due to large tongue and Difficult Airway- due to reduced neck mobility Future Recommendations: Recommend- induction with short-acting agent, and alternative techniques readily available Comments: DL miller 2, grade 3 view. Ventilated prior to getting glide scope. ETT passes easy thru vocal cords with glide.

## 2019-09-10 NOTE — Op Note (Signed)
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  PROCEDURE:  Left total hip arthroplasty through direct anterior approach.  IMPLANTS:  DePuy Sector Gription acetabular component size 52 and a single screw, size 36+4 neutral polyethylene liner, size 7 ACTIS femoral component with standard offset, size 36+5 ceramic hip ball.  SURGEON:  Jonn Shingles, MD  ASSISTANT:  Erskine Emery, PA-C  ANESTHESIA:  General.  ANTIBIOTICS:  3 g IV Ancef.  ESTIMATED BLOOD LOSS:  900 mL.  COMPLICATIONS:  None.  INDICATIONS:  The patient is a 53 year old gentleman with severe end-stage arthritis involving his left hip.  He has been well documented with x-rays and clinical exam.  He has complete loss of the joint space and bone-on-bone wear.  His left leg is  actually shorter than the right.  He is someone who is morbidly obese with a BMI of almost 40.  He has tried and failed all forms of conservative treatment.  At this point, he does wish to proceed with a total hip arthroplasty.  He does have high blood  pressure as well.  We talked in detail about the risk of acute blood loss anemia, nerve and vessel injury, fracture, infection, dislocation, DVT and implant failure.  He understands all these are significantly heightened given his obesity.  He  understands our goals are to decrease pain, improve mobility and overall improve quality of life.  DESCRIPTION OF PROCEDURE:  After informed consent was obtained, the appropriate left hip was marked.  He was brought to the operating room where general anesthesia was obtained while he was on a  stretcher.  I assessed his leg lengths and found he is  definitely short on his left operative side comparing left and right sides.  Traction boots were placed on both his feet.  Next, he was placed supine on the Hana fracture table, the perineal post in place and both legs in in-line skeletal traction device  and no traction applied.  His left operative hip was prepped and draped with DuraPrep and sterile drapes.  A time-out was called to identify correct patient and correct left hip.  We then made an incision just inferior and posterior to the anterior  superior iliac spine and carried this obliquely down the leg.  We dissected down to tensor fascia lata muscle.  Tensor fascia was then divided longitudinally to proceed with direct anterior approach to the hip.  We identified and cauterized circumflex  vessels, then identified the hip capsule, opened up the hip capsule in an L-type format, finding moderate joint effusion and significant hip disease around the hip.  This was a very difficult dissection given his obesity.  We did run into a little bit of  bleeders that we cauterize easily.  Of note, during the entire case, his blood pressure stayed significantly high and anesthesia had to give him a lot of different medications to try to bring it down and this certainly contributed to the acute blood  loss anemia.  Once we opened up the hip capsule, we placed Cobra retractors around the medial and lateral femoral neck and then made our femoral neck cut with an oscillating saw just proximal to the lesser trochanter, completing this with an osteotome.  We placed a corkscrew guide in the femoral head and removed the femoral head in its entirety and found no cartilage on it at all.  We then placed a bent Hohmann over the medial acetabular rim and removed bone remnants from the acetabulum as well as  remnants of the acetabular labrum and other debris.  We then began reaming under direct visualization from a size  44 reamer, going up to a size 52 with all reamers under direct visualization, the last reamer under direct fluoroscopy, so we could obtain  our depth of reaming, our inclination and anteversion.  I then placed the real DePuy Sector Gription acetabular component size 52 and a single screw and then a 36+4 polyethylene liner for that size acetabular component.  Attention was then turned to the  femur.  With the leg externally rotated to 120 degrees, extended and adducted, we are to place a Mueller retractor medially and Hohman retractor behind the greater trochanter, released lateral joint capsule and used a box-cutting osteotome to enter the  femoral canal and a rongeur to lateralize.  Then began broaching using the ACTIS broaching system from a size zero, going up all the way to a size 7.  With a size 7 in place, we trialed a standard offset femoral neck and 36+5 hip ball.  We brought the  leg back over in upward traction, internal rotation, reducing the pelvis and we felt like we needed just a little bit more leg length to equalize his offset and give him a little bit more leg length, so we decided to go with a real 36+5 hip ball.  We  dislocated the hip and removed the trial components.  We then placed the real ACTIS standard offset femoral component, size 7 and then the real 36+5 ceramic hip ball, again reduced this in the acetabulum.  We were pleased with stability.  We then  irrigated the soft tissue with normal saline solution using pulsatile lavage.  We closed the joint capsule with interrupted #1 Ethibond suture, followed by piecing back together the tensor fascia with #1 Vicryl.  Zero Vicryl was used to close the deep  tissue, 2-0 Vicryl was used to close the subcutaneous tissue and staples were used to close the skin.  Xeroform and Aquacel dressing was applied.  He was taken off the Hana table, awakened, extubated, and taken to the recovery room in stable condition.   All final counts were correct.   There were no complications noted.  Of note, Rexene Edison, PA-C, assisted with the entire case.  His assistance was crucial for facilitating all aspects of this case.  VN/NUANCE  D:09/10/2019 T:09/10/2019 JOB:[id number]

## 2019-09-10 NOTE — Evaluation (Signed)
Physical Therapy Evaluation Patient Details Name: Joseph Hinton MRN: [id number] DOB: Oct 27, 1966 Today's Date: 09/10/2019   History of Present Illness  Patient is 53 y.o. male s/p Lt THA anterior approach on 09/10/19 with PMH significant for DM, HLD, OA, neuropathy, HTN, and sleep apnea.    Clinical Impression  Enzo Treu is a 53 y.o. male POD 0 s/p Lt THA anterior approach. Patient reports independence with mobility at baseline. Patient is now limited by functional impairments (see PT problem list below) and requires min assist for transfers with RW. Patient was limited to stand step transfers to bedside recliner secondary to nausea and lightheadedness. Patient instructed in exercise to facilitate circulation. Patient will benefit from continued skilled PT interventions to address impairments and progress towards PLOF. Acute PT will follow to progress mobility and stair training in preparation for safe discharge home.    Follow Up Recommendations Follow surgeon's recommendation for DC plan and follow-up therapies    Equipment Recommendations  Rolling walker with 5" wheels    Recommendations for Other Services       Precautions / Restrictions Precautions Precautions: Fall Restrictions Weight Bearing Restrictions: No      Mobility  Bed Mobility Overal bed mobility: Needs Assistance Bed Mobility: Supine to Sit     Supine to sit: Min assist;HOB elevated     General bed mobility comments: verbal/tactile cues for use of bed rails, cues for use of Rt LE to assist with Lt LE mobility and assist from therapist. pt with episode of n/v upon rising and reported symptoms improved after bout of emesis. BP 144/85 mmHg when assessed.  Transfers Overall transfer level: Needs assistance Equipment used: Rolling walker (2 wheeled) Transfers: Sit to/from [employer] Sit to Stand: Min assist;From elevated surface Stand pivot transfers: Min assist       General transfer  comment: cues for safe hand placement and technique for power up with RW, min assist required to initiate and steady during rise. Pt reported some light headedness with rising and verbal cues for safe step pattern within RW to trasnfer to bedside recliner. no overt LOB noted.  Ambulation/Gait        General Gait Details: NT secondary to nausea and lightheadedness  Stairs       Wheelchair Mobility    Modified Rankin (Stroke Patients Only)       Balance Overall balance assessment: Needs assistance Sitting-balance support: Feet supported;No upper extremity supported;Single extremity supported Sitting balance-Leahy Scale: Fair     Standing balance support: During functional activity;Bilateral upper extremity supported Standing balance-Leahy Scale: Poor            Pertinent Vitals/Pain Pain Assessment: Faces Faces Pain Scale: Hurts little more Pain Location: Lt hip Pain Descriptors / Indicators: Grimacing;Guarding Pain Intervention(s): Limited activity within patient's tolerance;Monitored during session;Repositioned    Home Living Family/patient expects to be discharged to:: Private residence Living Arrangements: Spouse/significant other Available Help at Discharge: Family;Available 24 hours/day Type of Home: House Home Access: Stairs to enter Entrance Stairs-Rails: Doctor, general practice of Steps: 3 steps (they are deep platform steps) Home Layout: One level Home Equipment: Grab bars - tub/shower;Shower seat      Prior Function Level of Independence: Independent               Hand Dominance   Dominant Hand: Right    Extremity/Trunk Assessment   Upper Extremity Assessment Upper Extremity Assessment: Overall WFL for tasks assessed    Lower Extremity Assessment Lower Extremity Assessment: Overall [facility]  for tasks assessed;LLE deficits/detail LLE Deficits / Details: pt able to maintian 4/5 for quad with MMT LLE Sensation: WNL LLE Coordination:  WNL    Cervical / Trunk Assessment Cervical / Trunk Assessment: Normal  Communication   Communication: No difficulties  Cognition Arousal/Alertness: Awake/alert Behavior During Therapy: WFL for tasks assessed/performed Overall Cognitive Status: Within Functional Limits for tasks assessed         General Comments General comments (skin integrity, edema, etc.): pt's bed linens and gown were damp from sweat, no diaphoretic response to mobility    Exercises Total Joint Exercises Ankle Circles/Pumps: AROM;10 reps;Seated;Both   Assessment/Plan    PT Assessment Patient needs continued PT services  PT Problem List Decreased strength;Decreased balance;Decreased mobility;Decreased range of motion;Decreased activity tolerance;Decreased knowledge of use of DME       PT Treatment Interventions DME instruction;Functional mobility training;Balance training;Patient/family education;Modalities;Gait training;Therapeutic activities;Therapeutic exercise;Stair training    PT Goals (Current goals can be found in the Care Plan section)  Acute Rehab PT Goals Patient Stated Goal: to improve walking and get more independent PT Goal Formulation: With patient Time For Goal Achievement: 09/17/19 Potential to Achieve Goals: Good    Frequency 7X/week    AM-PAC PT "6 Clicks" Mobility  Outcome Measure Help needed turning from your back to your side while in a flat bed without using bedrails?: A Little Help needed moving from lying on your back to sitting on the side of a flat bed without using bedrails?: A Little Help needed moving to and from a bed to a chair (including a wheelchair)?: A Little Help needed standing up from a chair using your arms (e.g., wheelchair or bedside chair)?: A Little Help needed to walk in hospital room?: A Little Help needed climbing 3-5 steps with a railing? : A Lot 6 Click Score: 17    End of Session Equipment Utilized During Treatment: Gait belt Activity Tolerance:  Treatment limited secondary to medical complications (Comment)(pt limited by n/v) Patient left: with call bell/phone within reach;in chair;with chair alarm set Nurse Communication: Mobility status PT Visit Diagnosis: Muscle weakness (generalized) (M62.81);Difficulty in walking, not elsewhere classified (R26.2)    Time: 0272-5366 PT Time Calculation (min) (ACUTE ONLY): 31 min   Charges:   PT Evaluation $PT Eval Low Complexity: 1 Low          Kipp Brood, PT, DPT Physical Therapist with [facility]  09/10/2019 7:30 PM

## 2019-09-10 NOTE — Anesthesia Preprocedure Evaluation (Signed)
Anesthesia Evaluation  Patient identified by MRN, date of birth, ID band Patient awake    Reviewed: Allergy & Precautions, H&P , NPO status , Patient's Chart, lab work & pertinent test results  Airway Mallampati: II   Neck ROM: full    Dental   Pulmonary sleep apnea , former smoker,    breath sounds clear to auscultation       Cardiovascular hypertension,  Rhythm:regular Rate:Normal     Neuro/Psych    GI/Hepatic   Endo/Other  diabetes, Type 2Morbid obesity  Renal/GU      Musculoskeletal  (+) Arthritis ,   Abdominal   Peds  Hematology   Anesthesia Other Findings   Reproductive/Obstetrics                             Anesthesia Physical Anesthesia Plan  ASA: II  Anesthesia Plan: Spinal   Post-op Pain Management:    Induction: Intravenous  PONV Risk Score and Plan: 1 and Propofol infusion, Ondansetron, Midazolam and Treatment may vary due to age or medical condition  Airway Management Planned: Simple Face Mask  Additional Equipment:   Intra-op Plan:   Post-operative Plan:   Informed Consent: I have reviewed the patients History and Physical, chart, labs and discussed the procedure including the risks, benefits and alternatives for the proposed anesthesia with the patient or authorized representative who has indicated his/her understanding and acceptance.       Plan Discussed with: CRNA, Anesthesiologist and Surgeon  Anesthesia Plan Comments:         Anesthesia Quick Evaluation

## 2019-09-10 NOTE — Brief Op Note (Signed)
09/10/2019  1:04 PM  PATIENT:  Joseph Hinton  53 y.o. male  PRE-OPERATIVE DIAGNOSIS:  osteoarthritis left hip  POST-OPERATIVE DIAGNOSIS:  osteoarthritis left hip  PROCEDURE:  Procedure(s): LEFT TOTAL HIP ARTHROPLASTY ANTERIOR APPROACH (Left)  SURGEON:  Surgeon(s) and Role:    Mcarthur Rossetti, MD - Primary  PHYSICIAN ASSISTANT: Benita Stabile, PA-C  ANESTHESIA:   general  EBL:  900 mL   COUNTS:  YES  DICTATION: .Other Dictation: Dictation Number [id number]  PLAN OF CARE: Admit to inpatient   PATIENT DISPOSITION:  PACU - hemodynamically stable.   Delay start of Pharmacological VTE agent (>24hrs) due to surgical blood loss or risk of bleeding: no

## 2019-09-10 NOTE — Transfer of Care (Signed)
Immediate Anesthesia Transfer of Care Note  Patient: Joseph Hinton  Procedure(s) Performed: LEFT TOTAL HIP ARTHROPLASTY ANTERIOR APPROACH (Left Hip)  Patient Location: PACU  Anesthesia Type:General  Level of Consciousness: awake  Airway & Oxygen Therapy: Patient Spontanous Breathing and Patient connected to face mask oxygen  Post-op Assessment: Report given to RN and Post -op Vital signs reviewed and stable  Post vital signs: Reviewed and stable  Last Vitals:  Vitals Value Taken Time  BP 137/92 09/10/19 1326  Temp    Pulse 94 09/10/19 1328  Resp 18 09/10/19 1328  SpO2 95 % 09/10/19 1328  Vitals shown include unvalidated device data.  Last Pain:  Vitals:   09/10/19 0836  TempSrc: Oral      Patients Stated Pain Goal: 4 (15/52/08 0223)  Complications: No apparent anesthesia complications

## 2019-09-10 NOTE — H&P (Signed)
The patient understands that we are proceeding to surgery for a left total hip arthroplasty today.  There is been no acute changes in medical status.  See previous H&P.  I discussion the risk and benefits of surgery has been had an informed consent is obtained.

## 2019-09-11 DIAGNOSIS — M1612 Unilateral primary osteoarthritis, left hip: Secondary | ICD-10-CM | POA: Diagnosis not present

## 2019-09-11 LAB — CBC
HCT: 40 % (ref 39.0–52.0)
Hemoglobin: 13.9 g/dL (ref 13.0–17.0)
MCH: 30.5 pg (ref 26.0–34.0)
MCHC: 34.8 g/dL (ref 30.0–36.0)
MCV: 87.7 fL (ref 80.0–100.0)
Platelets: 142 10*3/uL — ABNORMAL LOW (ref 150–400)
RBC: 4.56 MIL/uL (ref 4.22–5.81)
RDW: 13.4 % (ref 11.5–15.5)
WBC: 9.6 10*3/uL (ref 4.0–10.5)
nRBC: 0 % (ref 0.0–0.2)

## 2019-09-11 LAB — BASIC METABOLIC PANEL
Anion gap: 15 (ref 5–15)
BUN: 16 mg/dL (ref 6–20)
CO2: 22 mmol/L (ref 22–32)
Calcium: 8.6 mg/dL — ABNORMAL LOW (ref 8.9–10.3)
Chloride: 97 mmol/L — ABNORMAL LOW (ref 98–111)
Creatinine, Ser: 0.55 mg/dL — ABNORMAL LOW (ref 0.61–1.24)
GFR calc Af Amer: >60 mL/min (ref >60)
GFR calc non Af Amer: >60 mL/min (ref >60)
Glucose, Bld: 176 mg/dL — ABNORMAL HIGH (ref 70–99)
Potassium: 4 mmol/L (ref 3.5–5.1)
Sodium: 134 mmol/L — ABNORMAL LOW (ref 135–145)

## 2019-09-11 LAB — GLUCOSE, CAPILLARY
Glucose-Capillary: 184 mg/dL — ABNORMAL HIGH (ref 70–99)
Glucose-Capillary: 247 mg/dL — ABNORMAL HIGH (ref 70–99)
Glucose-Capillary: 180 mg/dL — ABNORMAL HIGH (ref 70–99)
Glucose-Capillary: 170 mg/dL — ABNORMAL HIGH (ref 70–99)

## 2019-09-11 MED ORDER — OXYCODONE HCL 5 MG PO TABS: 5.0000 mg | ORAL_TABLET | ORAL | 0 refills | Status: AC | PRN

## 2019-09-11 MED ORDER — ASPIRIN 81 MG PO CHEW: 81.0000 mg | CHEWABLE_TABLET | Freq: Two times a day (BID) | ORAL | 0 refills | Status: AC

## 2019-09-11 MED ORDER — METHOCARBAMOL 500 MG PO TABS: 500.0000 mg | ORAL_TABLET | Freq: Four times a day (QID) | ORAL | 0 refills | Status: AC | PRN

## 2019-09-11 NOTE — Progress Notes (Signed)
Physical Therapy Treatment Patient Details Name: Joseph Hinton MRN: [id number] DOB: 20-Mar-1966 Today's Date: 09/11/2019    History of Present Illness Patient is 53 y.o. male s/p Lt THA anterior approach on 09/10/19 with PMH significant for DM, HLD, OA, neuropathy, HTN, and sleep apnea.    PT Comments    Progressing with mobility.    Follow Up Recommendations  Follow surgeon's recommendation for DC plan and follow-up therapies     Equipment Recommendations  Rolling walker with 5" wheels    Recommendations for Other Services       Precautions / Restrictions Precautions Precautions: Fall Restrictions Weight Bearing Restrictions: No Other Position/Activity Restrictions: WBAT    Mobility  Bed Mobility Overal bed mobility: Needs Assistance Bed Mobility: Sit to Supine       Sit to supine: HOB elevated;Min assist   General bed mobility comments: Assist for L LE.  Transfers Overall transfer level: Needs assistance Equipment used: Rolling walker (2 wheeled) Transfers: Sit to/from Stand Sit to Stand: Min guard         General transfer comment: Close guard for safety. VCs safety, hand placement.  Ambulation/Gait Ambulation/Gait assistance: Min guard Gait Distance (Feet): 68 Feet Assistive device: Rolling walker (2 wheeled) Gait Pattern/deviations: Step-through pattern;Decreased stride length;Step-to pattern     General Gait Details: Close guard for safety. Slow gait speed.   Stairs             Wheelchair Mobility    Modified Rankin (Stroke Patients Only)       Balance Overall balance assessment: Needs assistance         Standing balance support: Bilateral upper extremity supported Standing balance-Leahy Scale: Poor                              Cognition Arousal/Alertness: Awake/alert Behavior During Therapy: WFL for tasks assessed/performed Overall Cognitive Status: Within Functional Limits for tasks assessed                                         Exercises   General Comments        Pertinent Vitals/Pain Pain Assessment: 0-10 Pain Score: 5  Pain Location: L hip Pain Descriptors / Indicators: Sore;Discomfort Pain Intervention(s): Limited activity within patient's tolerance;Monitored during session;Repositioned    Home Living                      Prior Function            PT Goals (current goals can now be found in the care plan section) Progress towards PT goals: Progressing toward goals    Frequency    7X/week      PT Plan Current plan remains appropriate    Co-evaluation              AM-PAC PT "6 Clicks" Mobility   Outcome Measure  Help needed turning from your back to your side while in a flat bed without using bedrails?: A Little Help needed moving from lying on your back to sitting on the side of a flat bed without using bedrails?: A Little Help needed moving to and from a bed to a chair (including a wheelchair)?: A Little Help needed standing up from a chair using your arms (e.g., wheelchair or bedside chair)?: A Little Help needed to walk in hospital room?:  A Little Help needed climbing 3-5 steps with a railing? : A Little 6 Click Score: 18    End of Session Equipment Utilized During Treatment: Gait belt Activity Tolerance: Patient tolerated treatment well Patient left: in bed;with call bell/phone within reach   PT Visit Diagnosis: Pain;Other abnormalities of gait and mobility (R26.89) Pain - Right/Left: Left Pain - part of body: Hip     Time: 0233-4356 PT Time Calculation (min) (ACUTE ONLY): 14 min  Charges:  $Gait Training: 8-22 mins                       Weston Anna, [place] Pager: [phone] Office: [phone]

## 2019-09-11 NOTE — Progress Notes (Signed)
  Subjective: Patient stable.  Reports some burning in the lateral thigh region.  Generally is in slightly more discomfort than is usual for postop day #1 patient's   Objective: Vital signs in last 24 hours: Temp:  [97.6 F (36.4 C)-99.6 F (37.6 C)] 99.6 F (37.6 C) (11/07 0551) Pulse Rate:  [86-110] 110 (11/07 0551) Resp:  [12-21] 18 (11/07 0551) BP: (130-165)/(76-98) 130/76 (11/07 0821) SpO2:  [91 %-100 %] 99 % (11/07 0551) Weight:  [128.7 kg] 128.7 kg (11/06 1545)  Intake/Output from previous day: 11/06 0701 - 11/07 0700 In: 2844.4 [P.O.:480; I.V.:2364.4] Out: 2700 [Urine:1800; Blood:900] Intake/Output this shift: No intake/output data recorded.  Exam:  Sensation intact distally Dorsiflexion/Plantar flexion intact  Labs: Recent Labs    09/11/19 0256  HGB 13.9   Recent Labs    09/11/19 0256  WBC 9.6  RBC 4.56  HCT 40.0  PLT 142*   Recent Labs    09/11/19 0256  NA 134*  K 4.0  CL 97*  CO2 22  BUN 16  CREATININE 0.55*  GLUCOSE 176*  CALCIUM 8.6*   No results for input(s): LABPT, INR in the last 72 hours.  Assessment/Plan: Plan at this time is to mobilize with therapy.  Anticipate discharge Monday or Sunday.   Joseph Hinton 09/11/2019, 8:55 AM

## 2019-09-11 NOTE — Progress Notes (Signed)
Physical Therapy Treatment Patient Details Name: Joseph Hinton MRN: [id number] DOB: 1966/11/01 Today's Date: 09/11/2019    History of Present Illness Patient is 53 y.o. male s/p Lt THA anterior approach on 09/10/19 with PMH significant for DM, HLD, OA, neuropathy, HTN, and sleep apnea.    PT Comments    Progressing with mobility. Pt reported he has felt very hot overnight and this am-RN reported he had a temp this a.m. Gave pt cool cloths and helped him change his gown because it was sweaty. He was agreeable to working with therapy. Will continue to progress activity as tolerated.     Follow Up Recommendations  Follow surgeon's recommendation for DC plan and follow-up therapies     Equipment Recommendations  Rolling walker with 5" wheels    Recommendations for Other Services       Precautions / Restrictions Precautions Precautions: Fall Restrictions Weight Bearing Restrictions: No Other Position/Activity Restrictions: WBAT    Mobility  Bed Mobility               General bed mobility comments: oob in recliner  Transfers Overall transfer level: Needs assistance Equipment used: Rolling walker (2 wheeled) Transfers: Sit to/from Stand Sit to Stand: Min assist         General transfer comment: Small amount of assist to rise, steady. VCs safety, hand placement.  Ambulation/Gait Ambulation/Gait assistance: Min assist Gait Distance (Feet): 55 Feet Assistive device: Rolling walker (2 wheeled) Gait Pattern/deviations: Step-to pattern;Step-through pattern;Decreased stride length     General Gait Details: Intermittent assist to steady. Mild lightheadedness but pt tolerated activity well. Slow gait speed.   Stairs             Wheelchair Mobility    Modified Rankin (Stroke Patients Only)       Balance Overall balance assessment: Needs assistance         Standing balance support: Bilateral upper extremity supported Standing balance-Leahy Scale:  Poor                              Cognition Arousal/Alertness: Awake/alert Behavior During Therapy: WFL for tasks assessed/performed Overall Cognitive Status: Within Functional Limits for tasks assessed                                        Exercises Total Joint Exercises Ankle Circles/Pumps: AROM;Both;10 reps;Seated Quad Sets: AROM;Both;10 reps;Seated Heel Slides: AAROM;Left;10 reps;Seated Hip ABduction/ADduction: AAROM;Left;10 reps;Seated    General Comments        Pertinent Vitals/Pain Pain Assessment: 0-10 Pain Score: 4  Pain Location: L hip Pain Descriptors / Indicators: Sore;Discomfort Pain Intervention(s): Monitored during session;Ice applied;Repositioned    Home Living                      Prior Function            PT Goals (current goals can now be found in the care plan section) Progress towards PT goals: Progressing toward goals    Frequency    7X/week      PT Plan Current plan remains appropriate    Co-evaluation              AM-PAC PT "6 Clicks" Mobility   Outcome Measure  Help needed turning from your back to your side while in a flat bed without using bedrails?: A  Little Help needed moving from lying on your back to sitting on the side of a flat bed without using bedrails?: A Little Help needed moving to and from a bed to a chair (including a wheelchair)?: A Little Help needed standing up from a chair using your arms (e.g., wheelchair or bedside chair)?: A Little Help needed to walk in hospital room?: A Little Help needed climbing 3-5 steps with a railing? : A Little 6 Click Score: 18    End of Session Equipment Utilized During Treatment: Gait belt Activity Tolerance: Patient tolerated treatment well Patient left: in chair;with call bell/phone within reach   PT Visit Diagnosis: Pain;Other abnormalities of gait and mobility (R26.89) Pain - Right/Left: Left Pain - part of body: Hip      Time: 6789-3810 PT Time Calculation (min) (ACUTE ONLY): 22 min  Charges:  $Gait Training: 8-22 mins                      Weston Anna, [place] Pager: [phone] Office: [phone]

## 2019-09-11 NOTE — Care Management Obs Status (Signed)
[place] NOTIFICATION   Patient Details  Name: Bilbo Carcamo MRN: [id number] Date of Birth: 05-Jul-1966   Medicare Observation Status Notification Given:  Yes    Joaquin Courts, RN 09/11/2019, 4:12 PM

## 2019-09-11 NOTE — Progress Notes (Signed)
Patient in significant pain, oxycodone given. Tylenol given for elevated temperature. Facilities called to evaluate thermostat and air conditioning in room. Incentive spirometer education and watched patient do 12 times. MD aware. Will recheck temperature in 1 hour. No new orders.

## 2019-09-12 DIAGNOSIS — M1612 Unilateral primary osteoarthritis, left hip: Secondary | ICD-10-CM | POA: Diagnosis not present

## 2019-09-12 LAB — CBC
HCT: 35.6 % — ABNORMAL LOW (ref 39.0–52.0)
Hemoglobin: 12.5 g/dL — ABNORMAL LOW (ref 13.0–17.0)
MCH: 30.9 pg (ref 26.0–34.0)
MCHC: 35.1 g/dL (ref 30.0–36.0)
MCV: 88.1 fL (ref 80.0–100.0)
Platelets: 147 10*3/uL — ABNORMAL LOW (ref 150–400)
RBC: 4.04 MIL/uL — ABNORMAL LOW (ref 4.22–5.81)
RDW: 13.3 % (ref 11.5–15.5)
WBC: 10.7 10*3/uL — ABNORMAL HIGH (ref 4.0–10.5)
nRBC: 0 % (ref 0.0–0.2)

## 2019-09-12 LAB — GLUCOSE, CAPILLARY
Glucose-Capillary: 156 mg/dL — ABNORMAL HIGH (ref 70–99)
Glucose-Capillary: 158 mg/dL — ABNORMAL HIGH (ref 70–99)
Glucose-Capillary: 141 mg/dL — ABNORMAL HIGH (ref 70–99)

## 2019-09-12 NOTE — Progress Notes (Signed)
Physical Therapy Treatment Patient Details Name: Burdett Pinzon MRN: [id number] DOB: Dec 07, 1965 Today's Date: 09/12/2019    History of Present Illness Patient is 53 y.o. male s/p Lt THA anterior approach on 09/10/19 with PMH significant for DM, HLD, OA, neuropathy, HTN, and sleep apnea.    PT Comments    Progressing with mobility. Will plan to have a 2nd session prior to possible d/c home later today if he meets his PT goals.    Follow Up Recommendations  Follow surgeon's recommendation for DC plan and follow-up therapies     Equipment Recommendations  Rolling walker with 5" wheels    Recommendations for Other Services       Precautions / Restrictions Precautions Precautions: Fall Restrictions Weight Bearing Restrictions: No Other Position/Activity Restrictions: WBAT    Mobility  Bed Mobility Overal bed mobility: Needs Assistance Bed Mobility: Supine to Sit     Supine to sit: Min guard;HOB elevated     General bed mobility comments: Close guard. Increased time. Pt used gait belt as leg strap to get to EOB.  Transfers Overall transfer level: Needs assistance Equipment used: Rolling walker (2 wheeled) Transfers: Sit to/from Stand Sit to Stand: Min guard;From elevated surface         General transfer comment: Close guard for safety. VCs safety, hand placement.  Ambulation/Gait Ambulation/Gait assistance: Min guard Gait Distance (Feet): 75 Feet Assistive device: Rolling walker (2 wheeled) Gait Pattern/deviations: Step-to pattern;Step-through pattern;Decreased stride length     General Gait Details: Close guard for safety. Slow gait speed. Pt slowly beginning to use reciprocal gait pattern.   Stairs             Wheelchair Mobility    Modified Rankin (Stroke Patients Only)       Balance Overall balance assessment: Needs assistance         Standing balance support: Bilateral upper extremity supported Standing balance-Leahy Scale: Fair                               Cognition Arousal/Alertness: Awake/alert Behavior During Therapy: WFL for tasks assessed/performed Overall Cognitive Status: Within Functional Limits for tasks assessed                                        Exercises Total Joint Exercises Ankle Circles/Pumps: AROM;Both;10 reps;Supine Quad Sets: AROM;Both;10 reps;Supine Heel Slides: AAROM;Left;10 reps;Supine Hip ABduction/ADduction: AAROM;Left;10 reps;Supine    General Comments        Pertinent Vitals/Pain Pain Assessment: 0-10 Pain Score: 5  Pain Location: L thigh/groin Pain Descriptors / Indicators: Burning;Sore;Discomfort Pain Intervention(s): Monitored during session;Ice applied;Repositioned    Home Living                      Prior Function            PT Goals (current goals can now be found in the care plan section) Progress towards PT goals: Progressing toward goals    Frequency    7X/week      PT Plan Current plan remains appropriate    Co-evaluation              AM-PAC PT "6 Clicks" Mobility   Outcome Measure  Help needed turning from your back to your side while in a flat bed without using bedrails?: A Little Help needed moving from  lying on your back to sitting on the side of a flat bed without using bedrails?: A Little Help needed moving to and from a bed to a chair (including a wheelchair)?: A Little Help needed standing up from a chair using your arms (e.g., wheelchair or bedside chair)?: A Little Help needed to walk in hospital room?: A Little Help needed climbing 3-5 steps with a railing? : A Little 6 Click Score: 18    End of Session Equipment Utilized During Treatment: Gait belt Activity Tolerance: Patient tolerated treatment well Patient left: in chair;with call bell/phone within reach   PT Visit Diagnosis: Pain;Other abnormalities of gait and mobility (R26.89) Pain - Right/Left: Left Pain - part of body: Hip      Time: 1040-1106 PT Time Calculation (min) (ACUTE ONLY): 26 min  Charges:  $Gait Training: 8-22 mins $Therapeutic Exercise: 8-22 mins                        Weston Anna, PT Acute Rehabilitation Services Pager: [phone] Office: [phone]

## 2019-09-12 NOTE — Progress Notes (Signed)
Pt discharged to private vehicle with family.  No distress noted.   Skin warm and dry.  All belongings gathered and sent with him.   He specifically stated he did not want the CPAP mask or tubing.

## 2019-09-12 NOTE — TOC Progression Note (Signed)
Transition of Care [facility]) - Progression Note    Patient Details  Name: Joseph Hinton MRN: [id number] Date of Birth: Dec 26, 1965  Transition of Care [facility] CM/SW Contact  Joaquin Courts, RN Phone Number: 09/12/2019, 3:49 PM  Clinical Narrative:    CM spoke with patient. [facility]. Patient reports he has rolling walker at home, declines 3-in-1.    Expected Discharge Plan: [place] Barriers to Discharge: No Barriers Identified  Expected Discharge Plan and Services Expected Discharge Plan: [place]   Discharge Planning Services: CM Consult Post Acute Care Choice: [place] arrangements for the past 2 months: Single Family Home Expected Discharge Date: 09/12/19               DME Arranged: N/A DME Agency: NA       HH Arranged: PT [place] Agency: [place] ([place]) Date [place]: 09/12/19 Time [place]: 1548 Representative spoke with at [place]: [place] ([place]) Interventions    Readmission Risk Interventions No flowsheet data found.

## 2019-09-12 NOTE — Progress Notes (Signed)
Physical Therapy Treatment Patient Details Name: Joseph Hinton MRN: [id number] DOB: January 05, 1966 Today's Date: 09/12/2019    History of Present Illness Patient is 53 y.o. male s/p Lt THA anterior approach on 09/10/19 with PMH significant for DM, HLD, OA, neuropathy, HTN, and sleep apnea.    PT Comments    Progressing well with mobility. Reviewed/practiced gait training and stair training. All education completed. Okay to d/c from PT standpoint.    Follow Up Recommendations  Follow surgeon's recommendation for DC plan and follow-up therapies     Equipment Recommendations  Rolling walker with 5" wheels    Recommendations for Other Services       Precautions / Restrictions Precautions Precautions: Fall Restrictions Weight Bearing Restrictions: No Other Position/Activity Restrictions: WBAT    Mobility  Bed Mobility Overal bed mobility: Needs Assistance Bed Mobility: Supine to Sit          General bed mobility comments: pt in recliner  Transfers Overall transfer level: Needs assistance Equipment used: Rolling walker (2 wheeled) Transfers: Sit to/from Stand Sit to Stand: Min guard         General transfer comment: Close guard for safety. VCs safety, hand placement.  Ambulation/Gait Ambulation/Gait assistance: Min guard Gait Distance (Feet): 75 Feet Assistive device: Rolling walker (2 wheeled) Gait Pattern/deviations: Step-to pattern;Step-through pattern;Decreased stride length     General Gait Details: Close guard for safety. Slow gait speed. Pt beginning to use reciprocal gait pattern.   Stairs Stairs: Yes Stairs assistance: Min guard Stair Management: Step to pattern;Forwards;With walker Number of Stairs: 1 General stair comments: Close guard for safety. VCs safety, technique, sequence.   Wheelchair Mobility    Modified Rankin (Stroke Patients Only)       Balance Overall balance assessment: Needs assistance         Standing balance support:  Bilateral upper extremity supported Standing balance-Leahy Scale: Fair                              Cognition Arousal/Alertness: Awake/alert Behavior During Therapy: WFL for tasks assessed/performed Overall Cognitive Status: Within Functional Limits for tasks assessed                                        Exercises Total Joint Exercises Ankle Circles/Pumps: AROM;Both;10 reps;Supine Quad Sets: AROM;Both;10 reps;Supine Heel Slides: AAROM;Left;10 reps;Supine Hip ABduction/ADduction: AAROM;Left;10 reps;Supine    General Comments        Pertinent Vitals/Pain Pain Assessment: 0-10 Pain Score: 5  Pain Location: L thigh/groin Pain Descriptors / Indicators: Burning;Sore;Discomfort Pain Intervention(s): Monitored during session    Home Living                      Prior Function            PT Goals (current goals can now be found in the care plan section) Progress towards PT goals: Progressing toward goals    Frequency    7X/week      PT Plan Current plan remains appropriate    Co-evaluation              AM-PAC PT "6 Clicks" Mobility   Outcome Measure  Help needed turning from your back to your side while in a flat bed without using bedrails?: A Little Help needed moving from lying on your back to sitting on the  side of a flat bed without using bedrails?: A Little Help needed moving to and from a bed to a chair (including a wheelchair)?: A Little Help needed standing up from a chair using your arms (e.g., wheelchair or bedside chair)?: A Little Help needed to walk in hospital room?: A Little Help needed climbing 3-5 steps with a railing? : A Little 6 Click Score: 18    End of Session Equipment Utilized During Treatment: Gait belt Activity Tolerance: Patient tolerated treatment well Patient left: in chair;with call bell/phone within reach   PT Visit Diagnosis: Pain;Other abnormalities of gait and mobility (R26.89) Pain  - Right/Left: Left Pain - part of body: Hip     Time: 3300-7622 PT Time Calculation (min) (ACUTE ONLY): 8 min  Charges:  $Gait Training: 8-22 mins $Therapeutic Exercise: 8-22 mins              Weston Anna, PT Acute Rehabilitation Services Pager: [phone] Office: [phone]

## 2019-09-12 NOTE — Anesthesia Postprocedure Evaluation (Signed)
Anesthesia Post Note  Patient: Joseph Hinton  Procedure(s) Performed: LEFT TOTAL HIP ARTHROPLASTY ANTERIOR APPROACH (Left Hip)     Patient location during evaluation: PACU Anesthesia Type: General Level of consciousness: awake and alert Pain management: pain level controlled Vital Signs Assessment: post-procedure vital signs reviewed and stable Respiratory status: spontaneous breathing, nonlabored ventilation, respiratory function stable and patient connected to nasal cannula oxygen Cardiovascular status: blood pressure returned to baseline and stable Postop Assessment: no apparent nausea or vomiting Anesthetic complications: no    Last Vitals:  Vitals:   09/11/19 2149 09/12/19 0601  BP: 131/84 132/87  Pulse: (!) 108 (!) 103  Resp: 18 14  Temp: 36.9 C 36.7 C  SpO2: 98% 98%    Last Pain:  Vitals:   09/12/19 0601  TempSrc: Oral  PainSc:                  [place] S

## 2019-09-12 NOTE — Progress Notes (Signed)
    Home health agencies that serve [phone].        [place] Quality of Patient Care Rating Patient Survey Summary Rating  [facility] [phone] 3  out of 5 stars 2 out of [place] [phone] 4  out of 5 stars 4 out of [place] [phone] 4 out of 5 stars 3 out of [place] [phone] 4 out of 5 stars 5 out of 5 stars  [place] [phone] 4 out of 5 stars 5 out of [place] [phone] 4 out of 5 stars [place] [phone] 5 out of 5 stars 4 out of [place] number Footnote as displayed on [place]  1 This agency provides services under a federal waiver program to non-traditional, chronic long term population.  2 This agency provides services to a special needs population.  3 Not Available.  4 The number of patient episodes for this measure is too small to report.  5 This measure currently does not have data or provider has been certified/recertified for less than 6 months.  6 The national average for this measure is not provided because of state-to-state differences in data collection.  7 Medicare is not displaying rates for this measure for any home health agency, because of an issue with the data.  8 There were problems with the data and they are being corrected.  9 Zero, or very few, patients met the survey's rules for inclusion. The scores shown, if any, reflect a very small number of surveys and may not accurately tell how an agency is doing.  10 Survey results are based on less than 12 months of data.  11 Fewer than 70 patients completed the survey. Use the scores shown, if any, with caution as the number of surveys may be too low to accurately tell how an agency is doing.  12 No survey  results are available for this period.  13 Data suppressed by CMS for one or more quarters.

## 2019-09-12 NOTE — Progress Notes (Signed)
  Subjective: Joseph Hinton is a 53 y.o. male s/p left THA.  They are POD2.  Pt's pain is controlled.  Pt has ambulated with some difficulty but overall is progressing with PT.  Has not been able to do stairs yet.    Objective: Vital signs in last 24 hours: Temp:  [98.1 F (36.7 C)-101.5 F (38.6 C)] 98.1 F (36.7 C) (11/08 0601) Pulse Rate:  [103-118] 103 (11/08 0601) Resp:  [14-18] 14 (11/08 0601) BP: (117-132)/(70-87) 132/87 (11/08 0601) SpO2:  [94 %-98 %] 98 % (11/08 0601)  Intake/Output from previous day: 11/07 0701 - 11/08 0700 In: 1384.2 [P.O.:840; I.V.:544.2] Out: 1250 [Urine:1250] Intake/Output this shift: No intake/output data recorded.  Exam:  No gross blood or drainage overlying the dressing 2+ DP pulse Sensation intact distally in the left foot Able to dorsiflex and plantarflex the left foot   Labs: Recent Labs    09/11/19 0256 09/12/19 0309  HGB 13.9 12.5*   Recent Labs    09/11/19 0256 09/12/19 0309  WBC 9.6 10.7*  RBC 4.56 4.04*  HCT 40.0 35.6*  PLT 142* 147*   Recent Labs    09/11/19 0256  NA 134*  K 4.0  CL 97*  CO2 22  BUN 16  CREATININE 0.55*  GLUCOSE 176*  CALCIUM 8.6*   No results for input(s): LABPT, INR in the last 72 hours.  Assessment/Plan: Pt is POD2 s/p left THA.    -Plan to discharge to home today or tomorrow pending patient's pain and PT eval  -WBAT with a walker  -Okay to shower, dressing is waterproof.  Cautioned patient against soaking dressing in bath/pool/body of water     Donella Stade 09/12/2019, 7:57 AM

## 2019-09-13 ENCOUNTER — Encounter (HOSPITAL_COMMUNITY): Payer: Self-pay | Admitting: Orthopaedic Surgery

## 2019-09-13 DIAGNOSIS — G4733 Obstructive sleep apnea (adult) (pediatric): Secondary | ICD-10-CM | POA: Diagnosis not present

## 2019-09-14 DIAGNOSIS — Z471 Aftercare following joint replacement surgery: Secondary | ICD-10-CM | POA: Diagnosis not present

## 2019-09-14 DIAGNOSIS — Z6839 Body mass index (BMI) 39.0-39.9, adult: Secondary | ICD-10-CM | POA: Diagnosis not present

## 2019-09-14 DIAGNOSIS — E119 Type 2 diabetes mellitus without complications: Secondary | ICD-10-CM | POA: Diagnosis not present

## 2019-09-14 DIAGNOSIS — Z96642 Presence of left artificial hip joint: Secondary | ICD-10-CM | POA: Diagnosis not present

## 2019-09-14 DIAGNOSIS — Z79899 Other long term (current) drug therapy: Secondary | ICD-10-CM | POA: Diagnosis not present

## 2019-09-14 DIAGNOSIS — Z7984 Long term (current) use of oral hypoglycemic drugs: Secondary | ICD-10-CM | POA: Diagnosis not present

## 2019-09-14 DIAGNOSIS — I10 Essential (primary) hypertension: Secondary | ICD-10-CM | POA: Diagnosis not present

## 2019-09-15 ENCOUNTER — Telehealth: Payer: Self-pay | Admitting: Orthopaedic Surgery

## 2019-09-15 DIAGNOSIS — Z96642 Presence of left artificial hip joint: Secondary | ICD-10-CM | POA: Diagnosis not present

## 2019-09-15 DIAGNOSIS — Z471 Aftercare following joint replacement surgery: Secondary | ICD-10-CM | POA: Diagnosis not present

## 2019-09-15 DIAGNOSIS — Z79899 Other long term (current) drug therapy: Secondary | ICD-10-CM | POA: Diagnosis not present

## 2019-09-15 DIAGNOSIS — Z6839 Body mass index (BMI) 39.0-39.9, adult: Secondary | ICD-10-CM | POA: Diagnosis not present

## 2019-09-15 DIAGNOSIS — Z7984 Long term (current) use of oral hypoglycemic drugs: Secondary | ICD-10-CM | POA: Diagnosis not present

## 2019-09-15 DIAGNOSIS — E119 Type 2 diabetes mellitus without complications: Secondary | ICD-10-CM | POA: Diagnosis not present

## 2019-09-15 DIAGNOSIS — I10 Essential (primary) hypertension: Secondary | ICD-10-CM | POA: Diagnosis not present

## 2019-09-15 NOTE — Telephone Encounter (Signed)
Joseph Hinton from [facility] called. She would like verbal orders for PT 3x 1wk, 2x 2wk, 1x 1wk. She also would like a RX for durable equipment. He weighs 283 lbs. Need a rolling walker, 3 in 1 toilet seat, and a quad cane. The fax number is [phone]. Her call back number is [phone]

## 2019-09-15 NOTE — Telephone Encounter (Signed)
Joseph Hinton with Joseph Hinton called to get some clarification on two medications.  Patient has two muscle relaxers at the house.  Is he suppose to take both or just the Methocarbamol.  [phone].  Thank you.

## 2019-09-15 NOTE — Telephone Encounter (Signed)
Verbal order given and faxed Rx for the below to provided number

## 2019-09-15 NOTE — Telephone Encounter (Signed)
Verbal given  Told her to just only take Robaxin

## 2019-09-16 ENCOUNTER — Telehealth: Payer: Self-pay | Admitting: Orthopaedic Surgery

## 2019-09-16 NOTE — Telephone Encounter (Signed)
Lori from [facility] called to request VO for OT evaluation.  CB#[phone].  Thank you.

## 2019-09-16 NOTE — Telephone Encounter (Signed)
Verbal order given  

## 2019-09-17 DIAGNOSIS — Z6839 Body mass index (BMI) 39.0-39.9, adult: Secondary | ICD-10-CM | POA: Diagnosis not present

## 2019-09-17 DIAGNOSIS — E119 Type 2 diabetes mellitus without complications: Secondary | ICD-10-CM | POA: Diagnosis not present

## 2019-09-17 DIAGNOSIS — Z7984 Long term (current) use of oral hypoglycemic drugs: Secondary | ICD-10-CM | POA: Diagnosis not present

## 2019-09-17 DIAGNOSIS — Z96642 Presence of left artificial hip joint: Secondary | ICD-10-CM | POA: Diagnosis not present

## 2019-09-17 DIAGNOSIS — Z79899 Other long term (current) drug therapy: Secondary | ICD-10-CM | POA: Diagnosis not present

## 2019-09-17 DIAGNOSIS — E669 Obesity, unspecified: Secondary | ICD-10-CM | POA: Diagnosis not present

## 2019-09-17 DIAGNOSIS — E1169 Type 2 diabetes mellitus with other specified complication: Secondary | ICD-10-CM | POA: Diagnosis not present

## 2019-09-17 DIAGNOSIS — Z471 Aftercare following joint replacement surgery: Secondary | ICD-10-CM | POA: Diagnosis not present

## 2019-09-17 DIAGNOSIS — I10 Essential (primary) hypertension: Secondary | ICD-10-CM | POA: Diagnosis not present

## 2019-09-17 NOTE — Discharge Summary (Signed)
Physician Discharge Summary      Patient ID: Joseph Hinton MRN: [id number] DOB/AGE: Sep 22, 1966 53 y.o.  Admit date: 09/10/2019 Discharge date: 09/12/2019  Admission Diagnoses:  Principal Problem:   Unilateral primary osteoarthritis, left hip Active Problems:   Status post total replacement of left hip   Discharge Diagnoses:  Same  Surgeries: Procedure(s): LEFT TOTAL HIP ARTHROPLASTY ANTERIOR APPROACH on 09/10/2019   Consultants:   Discharged Condition: Stable  Hospital Course: Joseph Hinton is an 53 y.o. male who was admitted 09/10/2019 with a chief complaint of left hip pain, and found to have a diagnosis of left hip osteoarthritis.  They were brought to the operating room on 09/10/2019 and underwent the above named procedures.  Pt awoke from anesthesia without complication and was transferred to the floor. On POD1, patient progressed well with mobility in physical therapy.  On postop day 2, patient passed stair training and was cleared for discharge from the physical therapy standpoint.  His pain was controlled.  Patient was discharged on postop day 2.  Pt will f/u with Dr. Magnus IvanBlackman in clinic in ~2 weeks.   Antibiotics given:  Anti-infectives (From admission, onward)   Start     Dose/Rate Route Frequency Ordered Stop   09/10/19 1730  ceFAZolin (ANCEF) IVPB 2g/100 mL premix     2 g 200 mL/hr over 30 Minutes Intravenous Every 6 hours 09/10/19 1550 09/10/19 2233   09/10/19 0600  ceFAZolin (ANCEF) 3 g in dextrose 5 % 50 mL IVPB     3 g 100 mL/hr over 30 Minutes Intravenous On call to O.R. 09/09/19 [id number] 09/10/19 1130    .  Recent vital signs:  Vitals:   09/11/19 2149 09/12/19 0601  BP: 131/84 132/87  Pulse: (!) 108 (!) 103  Resp: 18 14  Temp: 98.4 F (36.9 C) 98.1 F (36.7 C)  SpO2: 98% 98%    Recent laboratory studies:  Results for orders placed or performed during the hospital encounter of 09/10/19  Glucose, capillary  Result Value Ref Range   Glucose-Capillary 158  (H) 70 - 99 mg/dL   Comment 1 Notify RN    Comment 2 Document in Chart   Glucose, capillary  Result Value Ref Range   Glucose-Capillary 344 (H) 70 - 99 mg/dL   Comment 1 Notify RN    Comment 2 Document in Chart   Glucose, capillary  Result Value Ref Range   Glucose-Capillary 342 (H) 70 - 99 mg/dL   Comment 1 Notify RN    Comment 2 Document in Chart   Glucose, capillary  Result Value Ref Range   Glucose-Capillary 286 (H) 70 - 99 mg/dL   Comment 1 Notify RN    Comment 2 Document in Chart   CBC  Result Value Ref Range   WBC 9.6 4.0 - 10.5 K/uL   RBC 4.56 4.22 - 5.81 MIL/uL   Hemoglobin 13.9 13.0 - 17.0 g/dL   HCT 11.940.0 14.739.0 - 82.952.0 %   MCV 87.7 80.0 - 100.0 fL   MCH 30.5 26.0 - 34.0 pg   MCHC 34.8 30.0 - 36.0 g/dL   RDW 56.213.4 13.011.5 - 86.515.5 %   Platelets 142 (L) 150 - 400 K/uL   nRBC 0.0 0.0 - 0.2 %  Basic metabolic panel  Result Value Ref Range   Sodium 134 (L) 135 - 145 mmol/L   Potassium 4.0 3.5 - 5.1 mmol/L   Chloride 97 (L) 98 - 111 mmol/L   CO2 22 22 - 32 mmol/L  Glucose, Bld 176 (H) 70 - 99 mg/dL   BUN 16 6 - 20 mg/dL   Creatinine, Ser 0.35 (L) 0.61 - 1.24 mg/dL   Calcium 8.6 (L) 8.9 - 10.3 mg/dL   GFR calc non Af Amer >60 >60 mL/min   GFR calc Af Amer >60 >60 mL/min   Anion gap 15 5 - 15  Glucose, capillary  Result Value Ref Range   Glucose-Capillary 283 (H) 70 - 99 mg/dL  Glucose, capillary  Result Value Ref Range   Glucose-Capillary 205 (H) 70 - 99 mg/dL  Glucose, capillary  Result Value Ref Range   Glucose-Capillary 184 (H) 70 - 99 mg/dL  Glucose, capillary  Result Value Ref Range   Glucose-Capillary 247 (H) 70 - 99 mg/dL  CBC  Result Value Ref Range   WBC 10.7 (H) 4.0 - 10.5 K/uL   RBC 4.04 (L) 4.22 - 5.81 MIL/uL   Hemoglobin 12.5 (L) 13.0 - 17.0 g/dL   HCT 59.7 (L) 41.6 - 38.4 %   MCV 88.1 80.0 - 100.0 fL   MCH 30.9 26.0 - 34.0 pg   MCHC 35.1 30.0 - 36.0 g/dL   RDW 53.6 46.8 - 03.2 %   Platelets 147 (L) 150 - 400 K/uL   nRBC 0.0 0.0 - 0.2 %   Glucose, capillary  Result Value Ref Range   Glucose-Capillary 180 (H) 70 - 99 mg/dL  Glucose, capillary  Result Value Ref Range   Glucose-Capillary 170 (H) 70 - 99 mg/dL  Glucose, capillary  Result Value Ref Range   Glucose-Capillary 156 (H) 70 - 99 mg/dL  Glucose, capillary  Result Value Ref Range   Glucose-Capillary 158 (H) 70 - 99 mg/dL  Glucose, capillary  Result Value Ref Range   Glucose-Capillary 141 (H) 70 - 99 mg/dL    Discharge Medications:   Allergies as of 09/12/2019   No Known Allergies     Medication List    TAKE these medications   Accu-Chek Aviva Plus test strip Generic drug: glucose blood USE AS DIRECTED TO CHECK BLOOD SUGARS TWICE DAILY   aspirin 81 MG chewable tablet Chew 1 tablet (81 mg total) by mouth 2 (two) times daily.   atorvastatin 10 MG tablet Commonly known as: LIPITOR Take 10 mg by mouth daily.   cyclobenzaprine 10 MG tablet Commonly known as: FLEXERIL Take 10 mg by mouth 3 (three) times daily as needed for muscle spasms.   enalapril 10 MG tablet Commonly known as: VASOTEC Take 10 mg by mouth daily.   gabapentin 600 MG tablet Commonly known as: NEURONTIN Take 600 mg by mouth 3 (three) times daily.   ibuprofen 600 MG tablet Commonly known as: ADVIL Take 600 mg by mouth 3 (three) times daily.   Jardiance 10 MG Tabs tablet Generic drug: empagliflozin Take 10 mg by mouth daily.   metFORMIN 1000 MG tablet Commonly known as: GLUCOPHAGE Take 1,000 mg by mouth 2 (two) times daily.   methocarbamol 500 MG tablet Commonly known as: ROBAXIN Take 1 tablet (500 mg total) by mouth every 6 (six) hours as needed for muscle spasms.   oxyCODONE 5 MG immediate release tablet Commonly known as: Oxy IR/ROXICODONE Take 1-2 tablets (5-10 mg total) by mouth every 4 (four) hours as needed for moderate pain (pain score 4-6).   Ozempic (1 MG/DOSE) 2 MG/1.5ML Sopn Generic drug: Semaglutide (1 MG/DOSE) Inject 1 mg into the skin every Wednesday.        Diagnostic Studies: Dg Pelvis Portable  Result Date:  09/10/2019 CLINICAL DATA:  Postop left hip replacement EXAM: PORTABLE PELVIS 1-2 VIEWS COMPARISON:  07/14/2019 FINDINGS: Mild to moderate arthritis right hip. Status post left hip replacement with intact hardware and normal alignment. No fracture. Gas in the soft tissues consistent with recent surgery IMPRESSION: Status post left hip replacement with expected postsurgical change Electronically Signed   By: Donavan Foil M.D.   On: 09/10/2019 15:12   Dg C-arm 1-60 Min-no Report  Result Date: 09/10/2019 Fluoroscopy was utilized by the requesting physician.  No radiographic interpretation.   Dg Hip Operative Unilat W Or W/o Pelvis Left  Result Date: 09/10/2019 CLINICAL DATA:  Left hip replacement. EXAM: OPERATIVE LEFT.  HIP (WITH PELVIS IF PERFORMED) 3 VIEWS TECHNIQUE: Fluoroscopic spot image(s) were submitted for interpretation post-operatively. COMPARISON:  07/14/2019. FINDINGS: Total left hip replacement. Hardware intact. Anatomic alignment. No acute bony abnormality. IMPRESSION: Total left hip replacement with anatomic alignment. Electronically Signed   By: Marcello Moores  Register   On: 09/10/2019 14:25    Disposition:   Discharge Instructions    Call MD / Call 911   Complete by: As directed    If you experience chest pain or shortness of breath, CALL 911 and be transported to the hospital emergency room.  If you develope a fever above 101 F, pus (white drainage) or increased drainage or redness at the wound, or calf pain, call your surgeon's office.   Constipation Prevention   Complete by: As directed    Drink plenty of fluids.  Prune juice may be helpful.  You may use a stool softener, such as Colace (over the counter) 100 mg twice a day.  Use MiraLax (over the counter) for constipation as needed.   Diet - low sodium heart healthy   Complete by: As directed    Discharge instructions   Complete by: As directed    You may shower,  dressing is waterproof.  Do not remove the dressing, we will remove it at your first post-op appointment.  Do not take a bath or soak the knee in a tub or pool.  You may weightbear as you can tolerate on the operative leg with a walker.  Work on getting your knee straight.  Put a pillow under your heel.  Do NOT put a pillow under your knee.  You will follow-up with Dr. Ninfa Linden in the clinic in 1-2 weeks at your given appointment date.   Increase activity slowly as tolerated   Complete by: As directed       Follow-up Information    [facility] Follow [facility].   Specialty: Home Health Services Why: agency will provide home health physical therapy, agency will call you to schedule firat visit. The phone number for the [facility] branch is [phone].           SignedDonella Stade 09/17/2019, 3:32 PM

## 2019-09-20 DIAGNOSIS — Z7984 Long term (current) use of oral hypoglycemic drugs: Secondary | ICD-10-CM | POA: Diagnosis not present

## 2019-09-20 DIAGNOSIS — I10 Essential (primary) hypertension: Secondary | ICD-10-CM | POA: Diagnosis not present

## 2019-09-20 DIAGNOSIS — Z471 Aftercare following joint replacement surgery: Secondary | ICD-10-CM | POA: Diagnosis not present

## 2019-09-20 DIAGNOSIS — E119 Type 2 diabetes mellitus without complications: Secondary | ICD-10-CM | POA: Diagnosis not present

## 2019-09-20 DIAGNOSIS — Z6839 Body mass index (BMI) 39.0-39.9, adult: Secondary | ICD-10-CM | POA: Diagnosis not present

## 2019-09-20 DIAGNOSIS — Z96642 Presence of left artificial hip joint: Secondary | ICD-10-CM | POA: Diagnosis not present

## 2019-09-20 DIAGNOSIS — Z79899 Other long term (current) drug therapy: Secondary | ICD-10-CM | POA: Diagnosis not present

## 2019-09-22 DIAGNOSIS — Z96642 Presence of left artificial hip joint: Secondary | ICD-10-CM | POA: Diagnosis not present

## 2019-09-22 DIAGNOSIS — Z79899 Other long term (current) drug therapy: Secondary | ICD-10-CM | POA: Diagnosis not present

## 2019-09-22 DIAGNOSIS — I10 Essential (primary) hypertension: Secondary | ICD-10-CM | POA: Diagnosis not present

## 2019-09-22 DIAGNOSIS — Z7984 Long term (current) use of oral hypoglycemic drugs: Secondary | ICD-10-CM | POA: Diagnosis not present

## 2019-09-22 DIAGNOSIS — Z471 Aftercare following joint replacement surgery: Secondary | ICD-10-CM | POA: Diagnosis not present

## 2019-09-22 DIAGNOSIS — E119 Type 2 diabetes mellitus without complications: Secondary | ICD-10-CM | POA: Diagnosis not present

## 2019-09-22 DIAGNOSIS — Z6839 Body mass index (BMI) 39.0-39.9, adult: Secondary | ICD-10-CM | POA: Diagnosis not present

## 2019-09-23 ENCOUNTER — Other Ambulatory Visit: Payer: Self-pay | Admitting: Orthopaedic Surgery

## 2019-09-23 ENCOUNTER — Telehealth: Payer: Self-pay | Admitting: Orthopaedic Surgery

## 2019-09-23 DIAGNOSIS — E119 Type 2 diabetes mellitus without complications: Secondary | ICD-10-CM | POA: Diagnosis not present

## 2019-09-23 DIAGNOSIS — Z471 Aftercare following joint replacement surgery: Secondary | ICD-10-CM | POA: Diagnosis not present

## 2019-09-23 DIAGNOSIS — Z7984 Long term (current) use of oral hypoglycemic drugs: Secondary | ICD-10-CM | POA: Diagnosis not present

## 2019-09-23 DIAGNOSIS — Z79899 Other long term (current) drug therapy: Secondary | ICD-10-CM | POA: Diagnosis not present

## 2019-09-23 DIAGNOSIS — Z96642 Presence of left artificial hip joint: Secondary | ICD-10-CM | POA: Diagnosis not present

## 2019-09-23 DIAGNOSIS — I10 Essential (primary) hypertension: Secondary | ICD-10-CM | POA: Diagnosis not present

## 2019-09-23 DIAGNOSIS — Z6839 Body mass index (BMI) 39.0-39.9, adult: Secondary | ICD-10-CM | POA: Diagnosis not present

## 2019-09-23 NOTE — Telephone Encounter (Signed)
Continue? Total hip 09/10/19

## 2019-09-23 NOTE — Telephone Encounter (Signed)
Marie/[facility] called and wanted to know if Joseph Hinton or Joseph Hinton received message of new fax # of where to send faxes for orders or equipment.  Fax # is [phone]

## 2019-09-27 ENCOUNTER — Inpatient Hospital Stay: Payer: Medicare HMO | Admitting: Orthopaedic Surgery

## 2019-09-27 ENCOUNTER — Encounter: Payer: Self-pay | Admitting: Orthopaedic Surgery

## 2019-09-27 ENCOUNTER — Other Ambulatory Visit: Payer: Self-pay

## 2019-09-27 ENCOUNTER — Ambulatory Visit (INDEPENDENT_AMBULATORY_CARE_PROVIDER_SITE_OTHER): Payer: Medicare HMO | Admitting: Orthopaedic Surgery

## 2019-09-27 DIAGNOSIS — Z96642 Presence of left artificial hip joint: Secondary | ICD-10-CM

## 2019-09-27 NOTE — Progress Notes (Signed)
The patient is now 17 days status post a left total hip arthroplasty.  He said he is doing well since surgery.  However he is a diabetic and his blood glucose did go up significantly after surgery.  He is working on those numbers coming down.  On exam his left hip incision looks good to remove the staples in place Steri-Strips.  His leg lengths are equal.  He is ambulating with a four-quadrant cane.  He can stop aspirin from my standpoint since he is mobilizing well there is no swelling in his feet.  His calf is soft.  He is only taken ibuprofen and Robaxin.  If he needs any of that called and he will let us know.  All question concerns were answered and addressed.  We will see him back in 4 weeks as he is doing overall but no x-rays are needed.

## 2019-09-28 DIAGNOSIS — I10 Essential (primary) hypertension: Secondary | ICD-10-CM | POA: Diagnosis not present

## 2019-09-28 DIAGNOSIS — Z79899 Other long term (current) drug therapy: Secondary | ICD-10-CM | POA: Diagnosis not present

## 2019-09-28 DIAGNOSIS — Z7984 Long term (current) use of oral hypoglycemic drugs: Secondary | ICD-10-CM | POA: Diagnosis not present

## 2019-09-28 DIAGNOSIS — Z96642 Presence of left artificial hip joint: Secondary | ICD-10-CM | POA: Diagnosis not present

## 2019-09-28 DIAGNOSIS — E119 Type 2 diabetes mellitus without complications: Secondary | ICD-10-CM | POA: Diagnosis not present

## 2019-09-28 DIAGNOSIS — Z6839 Body mass index (BMI) 39.0-39.9, adult: Secondary | ICD-10-CM | POA: Diagnosis not present

## 2019-09-28 DIAGNOSIS — Z471 Aftercare following joint replacement surgery: Secondary | ICD-10-CM | POA: Diagnosis not present

## 2019-09-29 ENCOUNTER — Inpatient Hospital Stay: Payer: Medicare HMO | Admitting: Orthopaedic Surgery

## 2019-09-29 DIAGNOSIS — Z6839 Body mass index (BMI) 39.0-39.9, adult: Secondary | ICD-10-CM | POA: Diagnosis not present

## 2019-09-29 DIAGNOSIS — I10 Essential (primary) hypertension: Secondary | ICD-10-CM | POA: Diagnosis not present

## 2019-09-29 DIAGNOSIS — Z7984 Long term (current) use of oral hypoglycemic drugs: Secondary | ICD-10-CM | POA: Diagnosis not present

## 2019-09-29 DIAGNOSIS — Z96642 Presence of left artificial hip joint: Secondary | ICD-10-CM | POA: Diagnosis not present

## 2019-09-29 DIAGNOSIS — Z471 Aftercare following joint replacement surgery: Secondary | ICD-10-CM | POA: Diagnosis not present

## 2019-09-29 DIAGNOSIS — E119 Type 2 diabetes mellitus without complications: Secondary | ICD-10-CM | POA: Diagnosis not present

## 2019-09-29 DIAGNOSIS — Z79899 Other long term (current) drug therapy: Secondary | ICD-10-CM | POA: Diagnosis not present

## 2019-10-04 DIAGNOSIS — H52209 Unspecified astigmatism, unspecified eye: Secondary | ICD-10-CM | POA: Diagnosis not present

## 2019-10-04 DIAGNOSIS — H5213 Myopia, bilateral: Secondary | ICD-10-CM | POA: Diagnosis not present

## 2019-10-06 ENCOUNTER — Telehealth: Payer: Self-pay | Admitting: Orthopaedic Surgery

## 2019-10-06 DIAGNOSIS — I10 Essential (primary) hypertension: Secondary | ICD-10-CM | POA: Diagnosis not present

## 2019-10-06 DIAGNOSIS — Z96642 Presence of left artificial hip joint: Secondary | ICD-10-CM | POA: Diagnosis not present

## 2019-10-06 DIAGNOSIS — Z7984 Long term (current) use of oral hypoglycemic drugs: Secondary | ICD-10-CM | POA: Diagnosis not present

## 2019-10-06 DIAGNOSIS — E119 Type 2 diabetes mellitus without complications: Secondary | ICD-10-CM | POA: Diagnosis not present

## 2019-10-06 DIAGNOSIS — Z6839 Body mass index (BMI) 39.0-39.9, adult: Secondary | ICD-10-CM | POA: Diagnosis not present

## 2019-10-06 DIAGNOSIS — Z79899 Other long term (current) drug therapy: Secondary | ICD-10-CM | POA: Diagnosis not present

## 2019-10-06 DIAGNOSIS — Z471 Aftercare following joint replacement surgery: Secondary | ICD-10-CM | POA: Diagnosis not present

## 2019-10-06 NOTE — Telephone Encounter (Signed)
Faxed Rx to number provided.

## 2019-10-06 NOTE — Telephone Encounter (Signed)
Patient's wife Joseph Hinton called gave another fax# to fax the Rx to [employer] for Quad cane,3 in [employer] and [employer]. Joseph Hinton asked if the fax can be sent today. The fax # is [phone]. Please see previous note. The number to contact Joseph Hinton is [phone]

## 2019-10-07 DIAGNOSIS — E785 Hyperlipidemia, unspecified: Secondary | ICD-10-CM | POA: Diagnosis not present

## 2019-10-07 DIAGNOSIS — E1165 Type 2 diabetes mellitus with hyperglycemia: Secondary | ICD-10-CM | POA: Diagnosis not present

## 2019-10-07 DIAGNOSIS — E669 Obesity, unspecified: Secondary | ICD-10-CM | POA: Diagnosis not present

## 2019-10-07 DIAGNOSIS — I10 Essential (primary) hypertension: Secondary | ICD-10-CM | POA: Diagnosis not present

## 2019-10-13 DIAGNOSIS — G4733 Obstructive sleep apnea (adult) (pediatric): Secondary | ICD-10-CM | POA: Diagnosis not present

## 2019-10-15 DIAGNOSIS — E1169 Type 2 diabetes mellitus with other specified complication: Secondary | ICD-10-CM | POA: Diagnosis not present

## 2019-10-15 DIAGNOSIS — Z0000 Encounter for general adult medical examination without abnormal findings: Secondary | ICD-10-CM | POA: Diagnosis not present

## 2019-10-15 DIAGNOSIS — M1990 Unspecified osteoarthritis, unspecified site: Secondary | ICD-10-CM | POA: Diagnosis not present

## 2019-10-15 DIAGNOSIS — E1140 Type 2 diabetes mellitus with diabetic neuropathy, unspecified: Secondary | ICD-10-CM | POA: Diagnosis not present

## 2019-10-15 DIAGNOSIS — Z87891 Personal history of nicotine dependence: Secondary | ICD-10-CM | POA: Diagnosis not present

## 2019-10-15 DIAGNOSIS — E781 Pure hyperglyceridemia: Secondary | ICD-10-CM | POA: Diagnosis not present

## 2019-10-15 DIAGNOSIS — R69 Illness, unspecified: Secondary | ICD-10-CM | POA: Diagnosis not present

## 2019-10-15 DIAGNOSIS — I10 Essential (primary) hypertension: Secondary | ICD-10-CM | POA: Diagnosis not present

## 2019-10-15 DIAGNOSIS — G4733 Obstructive sleep apnea (adult) (pediatric): Secondary | ICD-10-CM | POA: Diagnosis not present

## 2019-10-15 DIAGNOSIS — E11319 Type 2 diabetes mellitus with unspecified diabetic retinopathy without macular edema: Secondary | ICD-10-CM | POA: Diagnosis not present

## 2019-10-15 DIAGNOSIS — E785 Hyperlipidemia, unspecified: Secondary | ICD-10-CM | POA: Diagnosis not present

## 2019-10-18 DIAGNOSIS — R69 Illness, unspecified: Secondary | ICD-10-CM | POA: Diagnosis not present

## 2019-10-19 DIAGNOSIS — R69 Illness, unspecified: Secondary | ICD-10-CM | POA: Diagnosis not present

## 2019-10-25 ENCOUNTER — Ambulatory Visit (INDEPENDENT_AMBULATORY_CARE_PROVIDER_SITE_OTHER): Payer: Medicare HMO | Admitting: Orthopaedic Surgery

## 2019-10-25 ENCOUNTER — Other Ambulatory Visit: Payer: Self-pay

## 2019-10-25 ENCOUNTER — Encounter: Payer: Self-pay | Admitting: Orthopaedic Surgery

## 2019-10-25 DIAGNOSIS — Z96642 Presence of left artificial hip joint: Secondary | ICD-10-CM

## 2019-10-25 NOTE — Progress Notes (Signed)
The patient is now 6 weeks status post her left total hip arthroplasty.  He is doing well overall.  He is certainly frustrated in his postoperative course in terms of getting the appropriate DME and therapy.  Overall though he said he has good strength and good motion and is doing much better than he was before surgery.  On exam his left operative hip moves smoothly and fluidly.  He walks with just a slight limp.  At this point we will see him back in 6 months.  At that visit like a standing AP pelvis and lateral of his left operative hip.  All question concerns were answered and addressed.

## 2020-01-05 ENCOUNTER — Telehealth: Payer: Self-pay

## 2020-01-05 NOTE — Telephone Encounter (Signed)
Rhonda from [facility] calling asking for a Plan of Care and OT Eval for patient. She states she has faxed it a couples times. Checking to see if you have seen this? She states her cb# and fax# is [phone]

## 2020-01-05 NOTE — Telephone Encounter (Signed)
Ic,lmvm. Advised to please to re send to both our fax lines as, unfortunately, we are unable to locate the POC in question.

## 2020-01-11 DIAGNOSIS — E1169 Type 2 diabetes mellitus with other specified complication: Secondary | ICD-10-CM | POA: Diagnosis not present

## 2020-01-11 DIAGNOSIS — I10 Essential (primary) hypertension: Secondary | ICD-10-CM | POA: Diagnosis not present

## 2020-02-11 DIAGNOSIS — H52223 Regular astigmatism, bilateral: Secondary | ICD-10-CM | POA: Diagnosis not present

## 2020-02-11 DIAGNOSIS — H524 Presbyopia: Secondary | ICD-10-CM | POA: Diagnosis not present

## 2020-03-07 DIAGNOSIS — G4733 Obstructive sleep apnea (adult) (pediatric): Secondary | ICD-10-CM | POA: Diagnosis not present

## 2020-04-13 DIAGNOSIS — E1169 Type 2 diabetes mellitus with other specified complication: Secondary | ICD-10-CM | POA: Diagnosis not present

## 2020-04-13 DIAGNOSIS — I10 Essential (primary) hypertension: Secondary | ICD-10-CM | POA: Diagnosis not present

## 2020-04-24 ENCOUNTER — Ambulatory Visit: Payer: Medicare HMO | Admitting: Orthopaedic Surgery

## 2020-04-24 DIAGNOSIS — H5213 Myopia, bilateral: Secondary | ICD-10-CM | POA: Diagnosis not present

## 2020-04-24 DIAGNOSIS — H524 Presbyopia: Secondary | ICD-10-CM | POA: Diagnosis not present

## 2020-04-24 DIAGNOSIS — H52209 Unspecified astigmatism, unspecified eye: Secondary | ICD-10-CM | POA: Diagnosis not present

## 2020-04-26 ENCOUNTER — Ambulatory Visit: Payer: Medicare HMO | Admitting: Orthopaedic Surgery

## 2020-04-26 ENCOUNTER — Encounter: Payer: Self-pay | Admitting: Orthopaedic Surgery

## 2020-04-26 ENCOUNTER — Other Ambulatory Visit: Payer: Self-pay

## 2020-04-26 ENCOUNTER — Ambulatory Visit (INDEPENDENT_AMBULATORY_CARE_PROVIDER_SITE_OTHER): Payer: Medicare HMO

## 2020-04-26 DIAGNOSIS — Z96642 Presence of left artificial hip joint: Secondary | ICD-10-CM

## 2020-04-26 DIAGNOSIS — E1169 Type 2 diabetes mellitus with other specified complication: Secondary | ICD-10-CM | POA: Diagnosis not present

## 2020-04-26 DIAGNOSIS — R69 Illness, unspecified: Secondary | ICD-10-CM | POA: Diagnosis not present

## 2020-04-26 DIAGNOSIS — E785 Hyperlipidemia, unspecified: Secondary | ICD-10-CM | POA: Diagnosis not present

## 2020-04-26 DIAGNOSIS — G4733 Obstructive sleep apnea (adult) (pediatric): Secondary | ICD-10-CM | POA: Diagnosis not present

## 2020-04-26 DIAGNOSIS — I10 Essential (primary) hypertension: Secondary | ICD-10-CM | POA: Diagnosis not present

## 2020-04-26 DIAGNOSIS — Z1331 Encounter for screening for depression: Secondary | ICD-10-CM | POA: Diagnosis not present

## 2020-04-26 DIAGNOSIS — E669 Obesity, unspecified: Secondary | ICD-10-CM | POA: Diagnosis not present

## 2020-04-26 NOTE — Progress Notes (Signed)
Office Visit Note   Patient: Joseph Hinton           Date of Birth: 1966-10-05           MRN: [id number] Visit Date: 04/26/2020              Requested by: Alysia Penna, MD [address] [place],  [place] [address] PCP: Alysia Penna, MD   Assessment & Plan: Visit Diagnoses:  1. Status post total replacement of left hip     Plan: He will continue work on range of motion strengthening left hip.  Follow-up with [place] at 1 year postop in November at time AP pelvis lateral view left hip.  Questions encouraged and answered by Dr. Magnus Ivan self.  Follow-Up Instructions: No follow-ups on file.   Orders:  Orders Placed This Encounter  Procedures   XR HIP UNILAT W OR W/O PELVIS 1V LEFT   No orders of the defined types were placed in this encounter.     Procedures: No procedures performed   Clinical Data: No additional findings.   Subjective: Chief Complaint  Patient presents with   Left Hip - Follow-up    HPI Joseph Hinton returns today follow-up of his left total hip arthroplasty he is now just over 7 months postop.  He states overall he is doing good.  Feels his motion and strength are slowly improving.  He also notes that his radicular symptoms down the right leg when driving have resolved since having the left total hip arthroplasty performed.  He is happy with the results.  Feels his leg lengths are equal.  Review of Systems See HPI otherwise negative or noncontributory.  Objective: Vital Signs: There were no vitals taken for this visit.  Physical Exam Constitutional:      Appearance: He is not ill-appearing or diaphoretic.  Pulmonary:     Effort: Pulmonary effort is normal.  Neurological:     Mental Status: He is alert and oriented to person, place, and time.  Psychiatric:        Mood and Affect: Mood normal.     Ortho Exam  Fluid motion of the left hip limited external rotation.  No pain with internal/external rotation.  Left calf supple nontender  dorsiflexion plantarflexion ankle intact. Specialty Comments:  No specialty comments available.  Imaging: XR HIP UNILAT W OR W/O PELVIS 1V LEFT  Result Date: 04/26/2020 AP pelvis lateral view left hip: Total hip components are well seated.  No acute fractures no bony abnormalities.  Bilateral hips well located.    PMFS History: Patient Active Problem List   Diagnosis Date Noted   Status post total replacement of left hip 09/10/2019   Unilateral primary osteoarthritis, left hip 07/14/2019   DIABETES MELLITUS, TYPE II, UNCONTROLLED 09/29/2007   DIABETIC PERIPHERAL NEUROPATHY 09/28/2007   HYPERLIPIDEMIA 09/28/2007   OBESITY 09/28/2007   NICOTINE ADDICTION 09/28/2007   OBSTRUCTIVE SLEEP APNEA 09/28/2007   CATARACTS 09/28/2007   HYPERTENSION 09/28/2007   OSTEOARTHRITIS 09/28/2007   LOW BACK PAIN 09/28/2007   FOOT PAIN, LEFT 09/28/2007   Past Medical History:  Diagnosis Date   Diabetes mellitus without complication (HCC)    Foot infection    History of kidney stones    Hyperlipidemia    Hypertension    Neuropathy    OA (osteoarthritis of spine)    OSA on CPAP    PVC's (premature ventricular contractions)    Retinal hemorrhage, right     Family History  Problem Relation Age of Onset  Stroke Mother    Diabetes Mother    Hypertension Mother    Nephrolithiasis Brother    Lung cancer Father     Past Surgical History:  Procedure Laterality Date   TONSILLECTOMY AND ADENOIDECTOMY     TOTAL HIP ARTHROPLASTY Left 09/10/2019   Procedure: LEFT TOTAL HIP ARTHROPLASTY ANTERIOR APPROACH;  Surgeon: Mcarthur Rossetti, MD;  Location: [facility];  Service: Orthopedics;  Laterality: Left;   uvulaectomy     Social History   Occupational History   Not on file  Tobacco Use   Smoking status: Former Smoker   Smokeless tobacco: Never Used  Scientific laboratory technician Use: Never used  Substance and Sexual Activity   Alcohol use: No    Comment: quit in  1997   Drug use: No   Sexual activity: Not on file

## 2020-05-01 DIAGNOSIS — R69 Illness, unspecified: Secondary | ICD-10-CM | POA: Diagnosis not present

## 2020-05-04 DIAGNOSIS — E113553 Type 2 diabetes mellitus with stable proliferative diabetic retinopathy, bilateral: Secondary | ICD-10-CM | POA: Diagnosis not present

## 2020-07-13 DIAGNOSIS — E1121 Type 2 diabetes mellitus with diabetic nephropathy: Secondary | ICD-10-CM | POA: Diagnosis not present

## 2020-07-13 DIAGNOSIS — I10 Essential (primary) hypertension: Secondary | ICD-10-CM | POA: Diagnosis not present

## 2020-08-04 ENCOUNTER — Telehealth: Payer: Self-pay | Admitting: Orthopaedic Surgery

## 2020-08-04 NOTE — Telephone Encounter (Signed)
Myrene Buddy from [facility] called needing pre-authorization of 3 medications for patient. Please call and any agent can help submit the pre-authorizations. Please call [phone].

## 2020-09-04 ENCOUNTER — Ambulatory Visit: Payer: Medicare HMO | Admitting: Orthopaedic Surgery

## 2020-09-07 ENCOUNTER — Ambulatory Visit (INDEPENDENT_AMBULATORY_CARE_PROVIDER_SITE_OTHER): Payer: Medicare HMO | Admitting: Orthopaedic Surgery

## 2020-09-07 ENCOUNTER — Encounter: Payer: Self-pay | Admitting: Orthopaedic Surgery

## 2020-09-07 ENCOUNTER — Ambulatory Visit (INDEPENDENT_AMBULATORY_CARE_PROVIDER_SITE_OTHER): Payer: Medicare HMO

## 2020-09-07 DIAGNOSIS — G8929 Other chronic pain: Secondary | ICD-10-CM | POA: Diagnosis not present

## 2020-09-07 DIAGNOSIS — Z96642 Presence of left artificial hip joint: Secondary | ICD-10-CM | POA: Diagnosis not present

## 2020-09-07 DIAGNOSIS — M5442 Lumbago with sciatica, left side: Secondary | ICD-10-CM | POA: Diagnosis not present

## 2020-09-07 NOTE — Progress Notes (Signed)
Patient is a very pleasant 54 year old gentleman who is 1 year out from a total hip arthroplasty on the left side.  This was to treat pain from severe end-stage arthritis.  He says he is doing well and does not really know he has a hip replacement since it does not hurt him at all and is walking with no limp and really does not feel any pain at all.  He denies any right hip issues either.  On exam both hips move smoothly and fluidly.  An AP pelvis and lateral of the left hip shows a well-seated implant of the spine and groin with no complicating features.  At this point follow-up can be as needed since he is doing so well.  If he has any issues at all he knows to let us know.

## 2020-09-26 IMAGING — RF DG HIP (WITH PELVIS) OPERATIVE*L*
1 series · 2 of 2 positions shown · non-contrast
Comparison: 07/14/2019.

CLINICAL DATA: Left hip replacement.

EXAM:
OPERATIVE LEFT.  HIP (WITH PELVIS IF PERFORMED) 3 VIEWS
TECHNIQUE: Fluoroscopic spot image(s) were submitted for interpretation
post-operatively.

[Series 1: unknown protocol · 0.20mm/px · 2 of 2 slices shown]
[im 1/2]
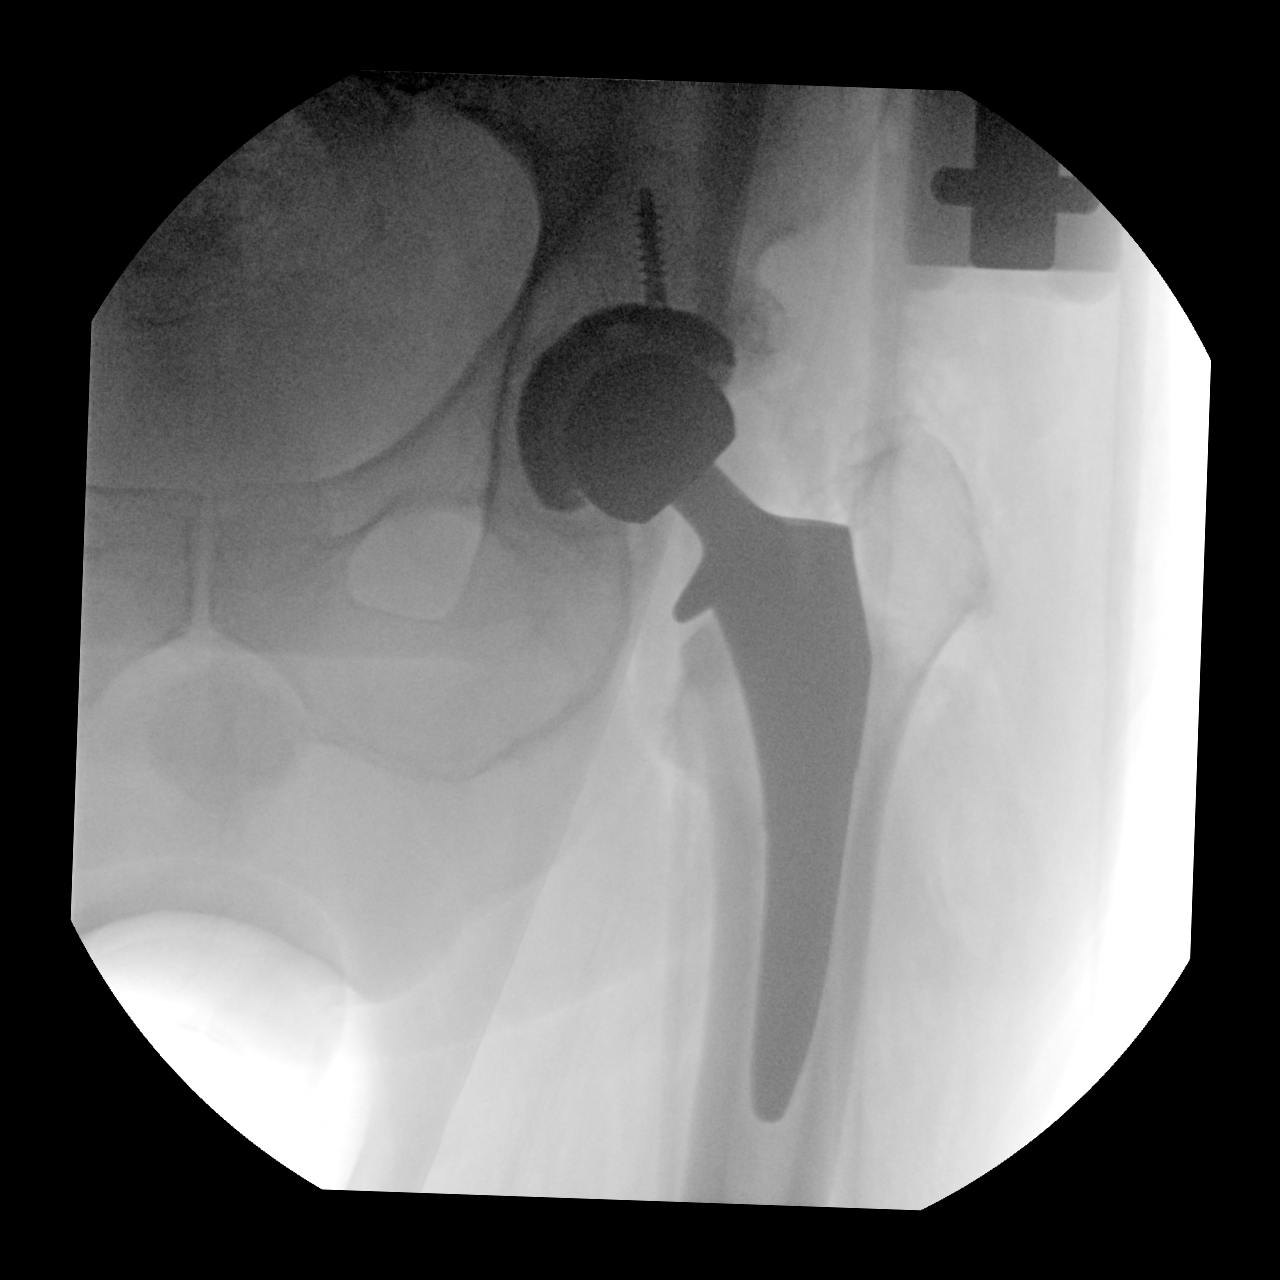
[im 2/2]
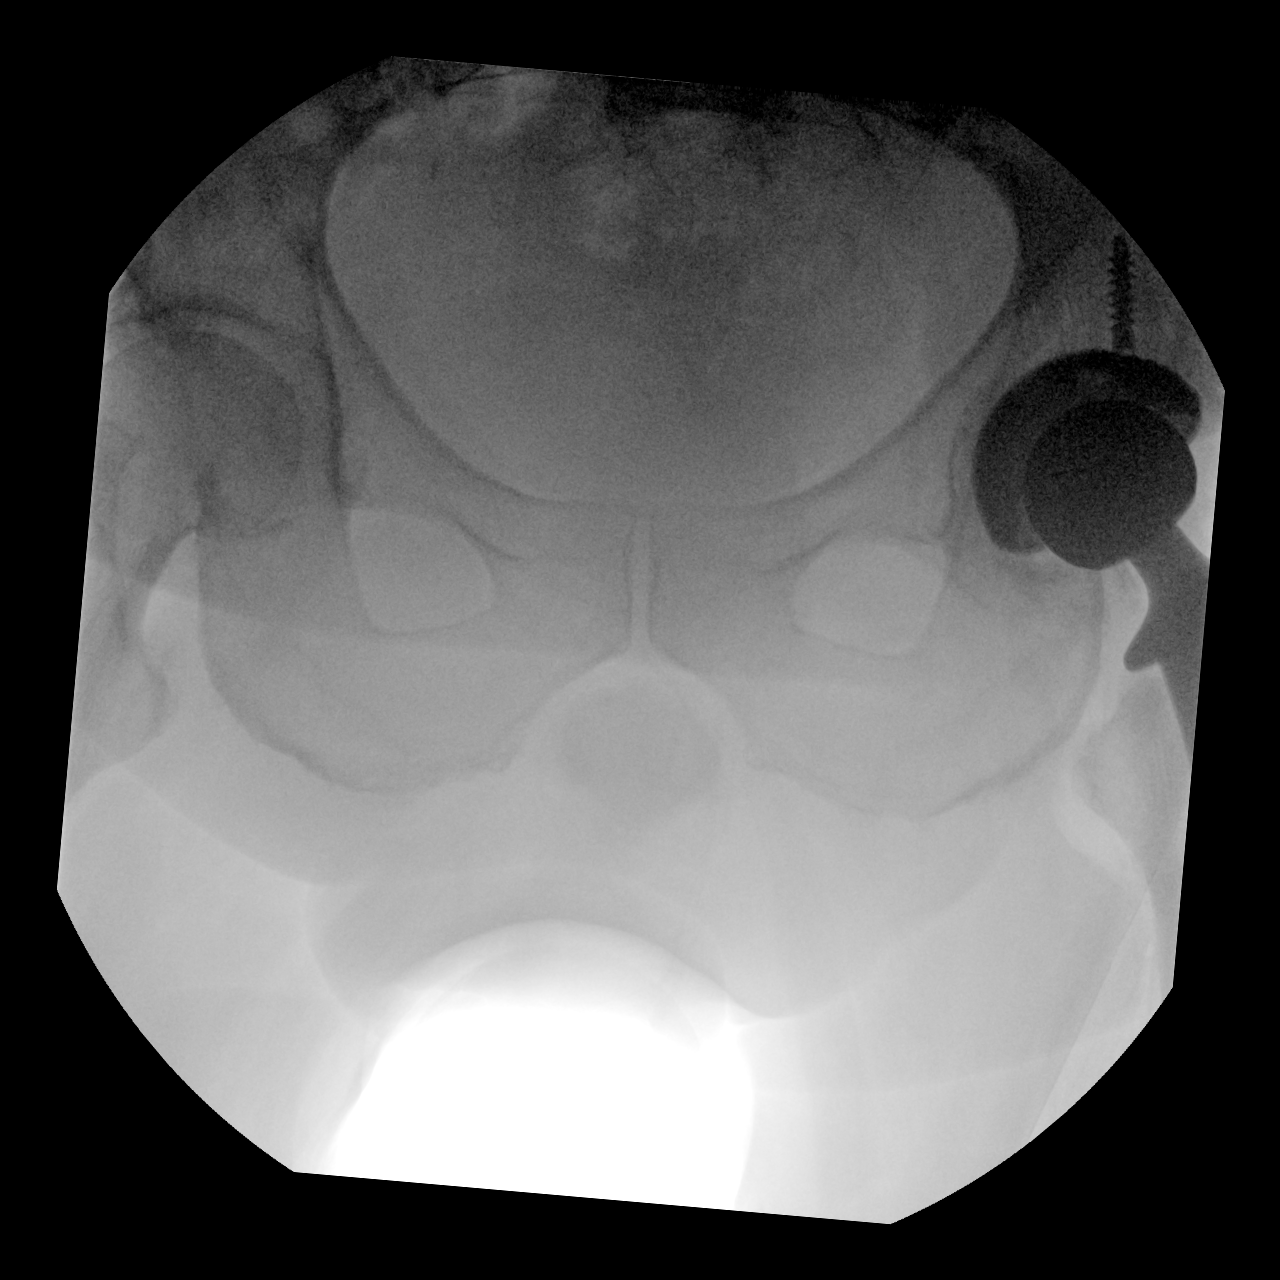

[2 of 2 positions shown; findings below may reference images not displayed]

FINDINGS: Total left hip replacement. Hardware intact. Anatomic alignment. No
acute bony abnormality.
IMPRESSION: Total left hip replacement with anatomic alignment.

## 2020-11-07 DIAGNOSIS — Z0000 Encounter for general adult medical examination without abnormal findings: Secondary | ICD-10-CM | POA: Diagnosis not present

## 2020-11-07 DIAGNOSIS — E1121 Type 2 diabetes mellitus with diabetic nephropathy: Secondary | ICD-10-CM | POA: Diagnosis not present

## 2020-11-07 DIAGNOSIS — E785 Hyperlipidemia, unspecified: Secondary | ICD-10-CM | POA: Diagnosis not present

## 2020-11-07 DIAGNOSIS — I10 Essential (primary) hypertension: Secondary | ICD-10-CM | POA: Diagnosis not present

## 2020-11-07 DIAGNOSIS — Z125 Encounter for screening for malignant neoplasm of prostate: Secondary | ICD-10-CM | POA: Diagnosis not present

## 2020-11-08 DIAGNOSIS — E781 Pure hyperglyceridemia: Secondary | ICD-10-CM | POA: Diagnosis not present

## 2020-11-08 DIAGNOSIS — G4733 Obstructive sleep apnea (adult) (pediatric): Secondary | ICD-10-CM | POA: Diagnosis not present

## 2020-11-08 DIAGNOSIS — Z0000 Encounter for general adult medical examination without abnormal findings: Secondary | ICD-10-CM | POA: Diagnosis not present

## 2020-11-08 DIAGNOSIS — Z87891 Personal history of nicotine dependence: Secondary | ICD-10-CM | POA: Diagnosis not present

## 2020-11-08 DIAGNOSIS — E1169 Type 2 diabetes mellitus with other specified complication: Secondary | ICD-10-CM | POA: Diagnosis not present

## 2020-11-08 DIAGNOSIS — I10 Essential (primary) hypertension: Secondary | ICD-10-CM | POA: Diagnosis not present

## 2020-11-08 DIAGNOSIS — Z6838 Body mass index (BMI) 38.0-38.9, adult: Secondary | ICD-10-CM | POA: Diagnosis not present

## 2020-11-08 DIAGNOSIS — E785 Hyperlipidemia, unspecified: Secondary | ICD-10-CM | POA: Diagnosis not present

## 2021-02-07 DIAGNOSIS — L84 Corns and callosities: Secondary | ICD-10-CM | POA: Diagnosis not present

## 2021-02-07 DIAGNOSIS — M79672 Pain in left foot: Secondary | ICD-10-CM | POA: Diagnosis not present

## 2021-02-07 DIAGNOSIS — I10 Essential (primary) hypertension: Secondary | ICD-10-CM | POA: Diagnosis not present

## 2021-02-07 DIAGNOSIS — I739 Peripheral vascular disease, unspecified: Secondary | ICD-10-CM | POA: Diagnosis not present

## 2021-02-07 DIAGNOSIS — M2041 Other hammer toe(s) (acquired), right foot: Secondary | ICD-10-CM | POA: Diagnosis not present

## 2021-02-07 DIAGNOSIS — G4733 Obstructive sleep apnea (adult) (pediatric): Secondary | ICD-10-CM | POA: Diagnosis not present

## 2021-02-07 DIAGNOSIS — M79671 Pain in right foot: Secondary | ICD-10-CM | POA: Diagnosis not present

## 2021-02-07 DIAGNOSIS — E1151 Type 2 diabetes mellitus with diabetic peripheral angiopathy without gangrene: Secondary | ICD-10-CM | POA: Diagnosis not present

## 2021-02-07 DIAGNOSIS — E1169 Type 2 diabetes mellitus with other specified complication: Secondary | ICD-10-CM | POA: Diagnosis not present

## 2021-02-07 DIAGNOSIS — E785 Hyperlipidemia, unspecified: Secondary | ICD-10-CM | POA: Diagnosis not present

## 2021-02-07 DIAGNOSIS — L603 Nail dystrophy: Secondary | ICD-10-CM | POA: Diagnosis not present

## 2021-02-07 DIAGNOSIS — M2042 Other hammer toe(s) (acquired), left foot: Secondary | ICD-10-CM | POA: Diagnosis not present

## 2021-06-07 DIAGNOSIS — I10 Essential (primary) hypertension: Secondary | ICD-10-CM | POA: Diagnosis not present

## 2021-06-07 DIAGNOSIS — E1169 Type 2 diabetes mellitus with other specified complication: Secondary | ICD-10-CM | POA: Diagnosis not present

## 2021-09-04 DIAGNOSIS — H52223 Regular astigmatism, bilateral: Secondary | ICD-10-CM | POA: Diagnosis not present

## 2021-09-04 DIAGNOSIS — H25813 Combined forms of age-related cataract, bilateral: Secondary | ICD-10-CM | POA: Diagnosis not present

## 2021-09-04 DIAGNOSIS — E113393 Type 2 diabetes mellitus with moderate nonproliferative diabetic retinopathy without macular edema, bilateral: Secondary | ICD-10-CM | POA: Diagnosis not present

## 2021-09-04 DIAGNOSIS — H04123 Dry eye syndrome of bilateral lacrimal glands: Secondary | ICD-10-CM | POA: Diagnosis not present

## 2021-09-18 DIAGNOSIS — H524 Presbyopia: Secondary | ICD-10-CM | POA: Diagnosis not present

## 2021-11-22 DIAGNOSIS — E1169 Type 2 diabetes mellitus with other specified complication: Secondary | ICD-10-CM | POA: Diagnosis not present

## 2021-11-22 DIAGNOSIS — Z79899 Other long term (current) drug therapy: Secondary | ICD-10-CM | POA: Diagnosis not present

## 2021-11-22 DIAGNOSIS — I10 Essential (primary) hypertension: Secondary | ICD-10-CM | POA: Diagnosis not present

## 2021-11-22 DIAGNOSIS — E785 Hyperlipidemia, unspecified: Secondary | ICD-10-CM | POA: Diagnosis not present

## 2021-11-22 DIAGNOSIS — Z125 Encounter for screening for malignant neoplasm of prostate: Secondary | ICD-10-CM | POA: Diagnosis not present

## 2021-11-22 DIAGNOSIS — Z0000 Encounter for general adult medical examination without abnormal findings: Secondary | ICD-10-CM | POA: Diagnosis not present

## 2021-11-28 DIAGNOSIS — Z1339 Encounter for screening examination for other mental health and behavioral disorders: Secondary | ICD-10-CM | POA: Diagnosis not present

## 2021-11-28 DIAGNOSIS — B359 Dermatophytosis, unspecified: Secondary | ICD-10-CM | POA: Diagnosis not present

## 2021-11-28 DIAGNOSIS — H43823 Vitreomacular adhesion, bilateral: Secondary | ICD-10-CM | POA: Diagnosis not present

## 2021-11-28 DIAGNOSIS — H33193 Other retinoschisis and retinal cysts, bilateral: Secondary | ICD-10-CM | POA: Diagnosis not present

## 2021-11-28 DIAGNOSIS — Z1331 Encounter for screening for depression: Secondary | ICD-10-CM | POA: Diagnosis not present

## 2021-11-28 DIAGNOSIS — E1169 Type 2 diabetes mellitus with other specified complication: Secondary | ICD-10-CM | POA: Diagnosis not present

## 2021-11-28 DIAGNOSIS — E785 Hyperlipidemia, unspecified: Secondary | ICD-10-CM | POA: Diagnosis not present

## 2021-11-28 DIAGNOSIS — I10 Essential (primary) hypertension: Secondary | ICD-10-CM | POA: Diagnosis not present

## 2021-11-28 DIAGNOSIS — Z87891 Personal history of nicotine dependence: Secondary | ICD-10-CM | POA: Diagnosis not present

## 2021-11-28 DIAGNOSIS — E113593 Type 2 diabetes mellitus with proliferative diabetic retinopathy without macular edema, bilateral: Secondary | ICD-10-CM | POA: Diagnosis not present

## 2021-11-28 DIAGNOSIS — G4733 Obstructive sleep apnea (adult) (pediatric): Secondary | ICD-10-CM | POA: Diagnosis not present

## 2021-11-28 DIAGNOSIS — Z0000 Encounter for general adult medical examination without abnormal findings: Secondary | ICD-10-CM | POA: Diagnosis not present

## 2021-11-28 DIAGNOSIS — H33322 Round hole, left eye: Secondary | ICD-10-CM | POA: Diagnosis not present

## 2021-12-31 DIAGNOSIS — Z1212 Encounter for screening for malignant neoplasm of rectum: Secondary | ICD-10-CM | POA: Diagnosis not present

## 2021-12-31 DIAGNOSIS — Z1211 Encounter for screening for malignant neoplasm of colon: Secondary | ICD-10-CM | POA: Diagnosis not present

## 2022-01-05 LAB — COLOGUARD: COLOGUARD: NEGATIVE

## 2022-03-19 DIAGNOSIS — E785 Hyperlipidemia, unspecified: Secondary | ICD-10-CM | POA: Diagnosis not present

## 2022-03-19 DIAGNOSIS — I10 Essential (primary) hypertension: Secondary | ICD-10-CM | POA: Diagnosis not present

## 2022-03-19 DIAGNOSIS — E1169 Type 2 diabetes mellitus with other specified complication: Secondary | ICD-10-CM | POA: Diagnosis not present

## 2022-03-19 DIAGNOSIS — G4733 Obstructive sleep apnea (adult) (pediatric): Secondary | ICD-10-CM | POA: Diagnosis not present

## 2022-07-17 DIAGNOSIS — E1169 Type 2 diabetes mellitus with other specified complication: Secondary | ICD-10-CM | POA: Diagnosis not present

## 2022-07-17 DIAGNOSIS — E785 Hyperlipidemia, unspecified: Secondary | ICD-10-CM | POA: Diagnosis not present

## 2022-07-17 DIAGNOSIS — I10 Essential (primary) hypertension: Secondary | ICD-10-CM | POA: Diagnosis not present

## 2022-07-17 DIAGNOSIS — G4733 Obstructive sleep apnea (adult) (pediatric): Secondary | ICD-10-CM | POA: Diagnosis not present

## 2022-12-02 DIAGNOSIS — E785 Hyperlipidemia, unspecified: Secondary | ICD-10-CM | POA: Diagnosis not present

## 2022-12-02 DIAGNOSIS — E669 Obesity, unspecified: Secondary | ICD-10-CM | POA: Diagnosis not present

## 2022-12-02 DIAGNOSIS — I10 Essential (primary) hypertension: Secondary | ICD-10-CM | POA: Diagnosis not present

## 2022-12-02 DIAGNOSIS — E1169 Type 2 diabetes mellitus with other specified complication: Secondary | ICD-10-CM | POA: Diagnosis not present

## 2022-12-02 DIAGNOSIS — E781 Pure hyperglyceridemia: Secondary | ICD-10-CM | POA: Diagnosis not present

## 2022-12-02 DIAGNOSIS — N433 Hydrocele, unspecified: Secondary | ICD-10-CM | POA: Diagnosis not present

## 2022-12-03 DIAGNOSIS — G4733 Obstructive sleep apnea (adult) (pediatric): Secondary | ICD-10-CM | POA: Diagnosis not present

## 2022-12-03 DIAGNOSIS — E785 Hyperlipidemia, unspecified: Secondary | ICD-10-CM | POA: Diagnosis not present

## 2022-12-03 DIAGNOSIS — M5414 Radiculopathy, thoracic region: Secondary | ICD-10-CM | POA: Diagnosis not present

## 2022-12-03 DIAGNOSIS — Z1339 Encounter for screening examination for other mental health and behavioral disorders: Secondary | ICD-10-CM | POA: Diagnosis not present

## 2022-12-03 DIAGNOSIS — Z0000 Encounter for general adult medical examination without abnormal findings: Secondary | ICD-10-CM | POA: Diagnosis not present

## 2022-12-03 DIAGNOSIS — E1169 Type 2 diabetes mellitus with other specified complication: Secondary | ICD-10-CM | POA: Diagnosis not present

## 2022-12-03 DIAGNOSIS — Z1331 Encounter for screening for depression: Secondary | ICD-10-CM | POA: Diagnosis not present

## 2022-12-03 DIAGNOSIS — I10 Essential (primary) hypertension: Secondary | ICD-10-CM | POA: Diagnosis not present

## 2022-12-17 DIAGNOSIS — M4724 Other spondylosis with radiculopathy, thoracic region: Secondary | ICD-10-CM | POA: Diagnosis not present

## 2022-12-19 DIAGNOSIS — M4724 Other spondylosis with radiculopathy, thoracic region: Secondary | ICD-10-CM | POA: Diagnosis not present

## 2022-12-24 DIAGNOSIS — M4724 Other spondylosis with radiculopathy, thoracic region: Secondary | ICD-10-CM | POA: Diagnosis not present

## 2022-12-26 DIAGNOSIS — M4724 Other spondylosis with radiculopathy, thoracic region: Secondary | ICD-10-CM | POA: Diagnosis not present

## 2023-01-02 DIAGNOSIS — M4724 Other spondylosis with radiculopathy, thoracic region: Secondary | ICD-10-CM | POA: Diagnosis not present

## 2023-01-07 DIAGNOSIS — M4724 Other spondylosis with radiculopathy, thoracic region: Secondary | ICD-10-CM | POA: Diagnosis not present

## 2023-04-29 ENCOUNTER — Telehealth: Payer: Self-pay

## 2023-04-29 NOTE — Telephone Encounter (Signed)
Received urgent referral from Dr Link Snuffer at [facility] for OSA - Cpap motor has exceeded life expectancy and he needs a new one. Last seen by Dr Frances Furbish in 2018 for OSA. Placed in sleep mailbox

## 2023-06-18 ENCOUNTER — Institutional Professional Consult (permissible substitution): Payer: Medicare PPO | Admitting: Neurology

## 2023-06-18 ENCOUNTER — Encounter: Payer: Self-pay | Admitting: *Deleted

## 2023-06-18 DIAGNOSIS — I10 Essential (primary) hypertension: Secondary | ICD-10-CM | POA: Diagnosis not present

## 2023-06-18 DIAGNOSIS — H9190 Unspecified hearing loss, unspecified ear: Secondary | ICD-10-CM | POA: Diagnosis not present

## 2023-06-18 DIAGNOSIS — M791 Myalgia, unspecified site: Secondary | ICD-10-CM | POA: Diagnosis not present

## 2023-06-18 DIAGNOSIS — E1169 Type 2 diabetes mellitus with other specified complication: Secondary | ICD-10-CM | POA: Diagnosis not present

## 2023-06-18 DIAGNOSIS — E785 Hyperlipidemia, unspecified: Secondary | ICD-10-CM | POA: Diagnosis not present

## 2023-06-18 DIAGNOSIS — G4733 Obstructive sleep apnea (adult) (pediatric): Secondary | ICD-10-CM | POA: Diagnosis not present

## 2023-06-18 DIAGNOSIS — I83812 Varicose veins of left lower extremities with pain: Secondary | ICD-10-CM | POA: Diagnosis not present

## 2023-06-19 ENCOUNTER — Encounter: Payer: Self-pay | Admitting: Neurology

## 2023-06-19 ENCOUNTER — Ambulatory Visit: Payer: Medicare PPO | Admitting: Neurology

## 2023-06-19 VITALS — BP 141/83 | HR 100 | Ht 71.0 in | Wt 272.4 lb

## 2023-06-19 DIAGNOSIS — E669 Obesity, unspecified: Secondary | ICD-10-CM | POA: Diagnosis not present

## 2023-06-19 DIAGNOSIS — G4733 Obstructive sleep apnea (adult) (pediatric): Secondary | ICD-10-CM

## 2023-06-19 DIAGNOSIS — R634 Abnormal weight loss: Secondary | ICD-10-CM | POA: Diagnosis not present

## 2023-06-19 NOTE — Patient Instructions (Signed)
I will order a home sleep test for re-evaluation of your sleep apnea, as testing was several years ago. The home sleep test is done (HST) to re-establish/confirm the sleep apnea diagnosis and to get them a new machine through the insurance. Our sleep lab staff will reach out to you to arrange for pickup and for tutorial of the test equipment. I will write for a new machine after the HST confirms the OSA (obstructive sleep apnea) diagnosis. Please remind the patient, to not use the CPAP the night of testing, so we get diagnostic data, not treatment data. We will schedule a follow-up appointment after the new machine set-up date, typically within 31 to 89 days post treatment start. The patient will need to show compliance with usage and fulfill a minimum usage percentage.

## 2023-06-19 NOTE — Progress Notes (Signed)
Subjective:    Patient ID: Joseph Hinton is a 57 y.o. male.  HPI    History:   Dear Dr. Link Snuffer,  I saw your patient, Joseph Hinton, upon your kind request in my sleep clinic today for evaluation of his sleep disorder, in particular, his prior diagnosis of obstructive sleep apnea.  The patient is accompanied by his wife today.  As you know, Joseph Hinton is a 57 year old male with an underlying medical history of diabetes, neuropathy, history of retinal hemorrhage, history of kidney stones, hypertension, hyperlipidemia, osteoarthritis, and history of osteomyelitis, and obesity, who was previously diagnosed with obstructive sleep apnea and placed on PAP therapy.  His Epworth sleepiness score is 3 out of 24, fatigue severity score is 63 out of 63.  He reports using his CPAP every night.  He reports that it has been turning off randomly at night and he has to turn it back on.  In the past 30 days there is very limited usage on his download, an average of only 7 minutes of usage is detected in the past 30 days and over the past 90 days he had limited usage after early June 2024.  He reports that he has been compliant with treatment.  He feels that he cannot sleep without his CPAP machine, he has a ResMed air sense 10 CPAP machine with a set pressure of 18 cm with EPR of 3.  His DME provider is [facility].  He is working on weight loss.  Bedtime is around midnight and rise time around 8 or 9.  He denies nightly nocturia but reports morning headaches.  He takes prescription ibuprofen for chronic pain particularly back pain.  He drinks caffeine in the form of soda and tea, about 3 to 4 glasses/day, currently no alcohol, quit smoking in 2000.  He lives with his wife, they may have in [place].   I reviewed your office note from 12/03/22.   I had evaluated him several years ago for sleep apnea.  He has been on PAP therapy for years.  He has recently noticed an error message on his CPAP machine indicating that  the motor life has been exceeded.  He has not had a sleep study in many years. He did not follow-up in our sleep clinic.  Previously:  07/02/2017: 57 year old right-handed gentleman with an underlying medical history of type 2 diabetes, with associated neuropathy and history of retinal hemorrhage with status post laser surgery, history of PVCs, hypertension, hyperlipidemia, osteoarthritis and recurrent foot infections and left foot osteomyelitis, and morbid obesity with a BMI of over 40, who was previously diagnosed with obstructive sleep apnea and placed on CPAP therapy. Sleep study testing was many years ago and his CPAP machine is several years old, likely from 2011, per patient report. I was able to review his compliance data from the card and his machine. This was from 06/03/2017 through 07/02/2017, last 30 days, during which time he used his machine 29 days with percent used days greater than 4 hours at 93%, indicating excellent compliance with a high average usage of 11 hours and 22 minutes, which is quite high. Leak is in the acceptable range with the 95th percentile at 19.8 L/m, pressure at 18 cm. Prior sleep study results are not available for my review today. I reviewed your office note from 04/30/2017, which you kindly included.  He is status post sleep apnea surgery including tonsillectomy, UPPP. He quit smoking in 2000. He lives with his wife. He is  on disability.  His original Dx for OSA, he recalls, was in 1999, pressure, as he recalls was originally 13 cm, then at some point 15 cm and most recently 18 cm. He recalls being told, he had severe OSA. His weight has fluctuated. He is currently wearing a boot from his foot surgery and recurrent foot infection. He also had surgery in the past to his right foot. For his diabetic neuropathy he is on high-dose gabapentin, 600 mg 3 times a day. His main complaint is not being rested during the day and feeling tired all the time. He's not frankly sleepy.  Epworth Sleepiness Scale score is 0 out of 24 today, fatigue score is 63 out of 63. He does not have night to night nocturia. He is not aware of any family history of obstructive sleep apnea. In order to elevate his legs he spends a lot of time in bed. He is not indicating any issues with his current CPAP machine. We called his DME company and are advised that he has had this machine since November 2011. [facility] also does not have the most recent results of his sleep study. He does not have any issues with the CPAP pressure or the mask and indicates full compliance with treatment. He quit smoking in 2001. He does not currently drink any alcohol. He drinks caffeine in the form of coffee, 2-3 cups per day. He is on disability. He lives with his wife, they have 1 child.    His Past Medical History Is Significant For: Past Medical History:  Diagnosis Date   Diabetes mellitus without complication (HCC)    Foot infection    History of kidney stones    Hyperlipidemia    Hypertension    Neuropathy    OA (osteoarthritis of spine)    OSA on CPAP    PVC's (premature ventricular contractions)    Retinal hemorrhage, right     His Past Surgical History Is Significant For: Past Surgical History:  Procedure Laterality Date   TONSILLECTOMY AND ADENOIDECTOMY     TOTAL HIP ARTHROPLASTY Left 09/10/2019   Procedure: LEFT TOTAL HIP ARTHROPLASTY ANTERIOR APPROACH;  Surgeon: Kathryne Hitch, MD;  Location: [facility];  Service: Orthopedics;  Laterality: Left;   uvulaectomy      His Family History Is Significant For: Family History  Problem Relation Age of Onset   Stroke Mother    Diabetes Mother    Hypertension Mother    Lung cancer Father    Nephrolithiasis Brother    Sleep apnea Neg Hx     His Social History Is Significant For: Social History   Socioeconomic History   Marital status: Married    Spouse name: Not on file   Number of children: Not on file   Years of education: Not on  file   Highest education level: Not on file  Occupational History   Not on file  Tobacco Use   Smoking status: Former   Smokeless tobacco: Never  Vaping Use   Vaping status: Never Used  Substance and Sexual Activity   Alcohol use: No    Comment: quit in 1997   Drug use: No   Sexual activity: Not on file  Other Topics Concern   Not on file  Social History Narrative   Not on file   Social Determinants of Health   Financial Resource Strain: Not on file  Food Insecurity: Not on file  Transportation Needs: Not on file  Physical Activity: Not on  file  Stress: Not on file  Social Connections: Not on file    His Allergies Are:  No Known Allergies:   His Current Medications Are:  Outpatient Encounter Medications as of 06/19/2023  Medication Sig   aspirin 81 MG chewable tablet Chew 1 tablet (81 mg total) by mouth 2 (two) times daily.   atorvastatin (LIPITOR) 10 MG tablet Take 10 mg by mouth daily.   cyclobenzaprine (FLEXERIL) 10 MG tablet Take 10 mg by mouth 3 (three) times daily as needed for muscle spasms.   enalapril (VASOTEC) 10 MG tablet Take 10 mg by mouth daily.   gabapentin (NEURONTIN) 600 MG tablet Take 600 mg by mouth 3 (three) times daily.   ibuprofen (ADVIL) 600 MG tablet Take 600 mg by mouth 3 (three) times daily.   JARDIANCE 10 MG TABS tablet Take 10 mg by mouth daily.   metFORMIN (GLUCOPHAGE) 1000 MG tablet Take 1,000 mg by mouth 2 (two) times daily.   OZEMPIC, 1 MG/DOSE, 2 MG/1.5ML SOPN Inject 1 mg into the skin every Wednesday.   ACCU-CHEK AVIVA PLUS test strip USE AS DIRECTED TO CHECK BLOOD SUGARS TWICE DAILY   methocarbamol (ROBAXIN) 500 MG tablet Take 1 tablet (500 mg total) by mouth every 6 (six) hours as needed for muscle spasms.   oxyCODONE (OXY IR/ROXICODONE) 5 MG immediate release tablet Take 1-2 tablets (5-10 mg total) by mouth every 4 (four) hours as needed for moderate pain (pain score 4-6).   No facility-administered encounter medications on file as  of 06/19/2023.  :  Review of Systems:  Out of a complete 14 point review of systems, all are reviewed and negative with the exception of these symptoms as listed below:  Review of Systems  Neurological:        Pt here for sleep consult  Pt states wears CPAP all night  Pt states here due to message on pap machine exceeded motor expectancy Pt states fatigue   ESS:3 FSS: 63      Objective:  Neurological Exam  Physical Exam Physical Examination:   Vitals:   06/19/23 1039  BP: (!) 141/83  Pulse: 100    General Examination: The patient is a very pleasant 57 y.o. male in no acute distress. He appears well-developed and well-nourished and well groomed.   HEENT: Normocephalic, atraumatic, pupils are equal, round and reactive to light, tracking well-preserved, no nystagmus noted.  Hearing grossly intact, face is symmetric with normal facial animation, speech is clear without dysarthria, hypophonia or voice tremor, neck circumference 19-1/2 inches.  Airway examination reveals status post UPPP, tonsils absent.     Chest: Clear to auscultation without wheezing, rhonchi or crackles noted.   Heart: S1+S2+0, regular and normal without murmurs, rubs or gallops noted.    Abdomen: Soft, non-tender and non-distended with normal bowel sounds appreciated on auscultation.   Extremities: There is swelling noted in the distal lower extremities bilaterally.    Skin: Chronic appearing changes in the distal lower extremities bilaterally.   Musculoskeletal: exam reveals no obvious joint deformities.      Neurologically:  Mental status: The patient is awake, alert and oriented in all 4 spheres. His immediate and remote memory, attention, language skills and fund of knowledge are appropriate. There is no evidence of aphasia, agnosia, apraxia or anomia. Speech is clear with normal prosody and enunciation. Thought process is linear. Mood is normal and affect is normal.  Cranial nerves II - XII are as  described above under HEENT exam. In addition:  shoulder shrug is normal with equal shoulder height noted. Motor exam: Normal bulk, moving all 4 extremities, no obvious resting or action tremor.  Fine motor skills are grossly intact.   Gait, station and balance: He stands without difficulty, he walks without difficulty.       Assessment and Plan:  In summary, Kywan Teutsch is a 57 year old male with an underlying medical history of diabetes, neuropathy, history of retinal hemorrhage, history of kidney stones, hypertension, hyperlipidemia, osteoarthritis, and history of osteomyelitis, and obesity, who presents for evaluation of his obstructive sleep apnea.  He was originally diagnosed in 1999 as per his recollection.  He has had this particular CPAP machine since 2018 as he recalls.  He indicates full compliance with treatment and has had benefit from it for all these years but lately, his machine has been working erratically and turns off at night, he has also seen an error message on it.  We will proceed with a home sleep test for reevaluation.  He has been working on weight loss and has achieved a significant amount of weight loss over the course of the past 6 years, he is commended for his weight loss success.  He is advised to continue to use his current CPAP machine and I will hopefully be able to prescribe a new machine after his home sleep test is completed.  He and his wife were advised regarding the home sleep test procedure, all questions were answered, we will plan to follow-up after he has started his new CPAP.  Once he is stable, we can see him hopefully yearly thereafter.  I answered all the questions today and the patient and his wife were in agreement.   Thank you very much for allowing me to participate in the care of this nice patient. If I can be of any further assistance to you please do not hesitate to call me at [phone].   Sincerely,     [name], MD, PhD

## 2023-07-02 ENCOUNTER — Ambulatory Visit (INDEPENDENT_AMBULATORY_CARE_PROVIDER_SITE_OTHER): Payer: Medicare PPO | Admitting: Neurology

## 2023-07-02 DIAGNOSIS — G4733 Obstructive sleep apnea (adult) (pediatric): Secondary | ICD-10-CM

## 2023-07-02 DIAGNOSIS — E669 Obesity, unspecified: Secondary | ICD-10-CM

## 2023-07-02 DIAGNOSIS — G4734 Idiopathic sleep related nonobstructive alveolar hypoventilation: Secondary | ICD-10-CM

## 2023-07-02 DIAGNOSIS — R634 Abnormal weight loss: Secondary | ICD-10-CM

## 2023-07-09 ENCOUNTER — Telehealth: Payer: Self-pay

## 2023-07-09 NOTE — Telephone Encounter (Signed)
Spoke to patient about the sleep lab receiving the results of the mail out home sleep study. Report has been generated for the MD to read. Once it is, pt will receive a call to go over the results. Pt was also instructed to now throw away the HST device.

## 2023-07-10 ENCOUNTER — Telehealth: Payer: Self-pay | Admitting: Neurology

## 2023-07-10 NOTE — Telephone Encounter (Signed)
  [name], MD 07/10/2023  1:22 PM EDT Back to Top    Patient was referred by PCP for reevaluation of his OSA.  He has been on CPAP therapy for the past several years, he is eligible for new machine.  He had a home sleep test on 07/05/2023 which indicated moderate obstructive sleep apnea but significant oxygen desaturations, as low as 71%.  Please advise patient that I would like to write for a new AutoPap machine.  We will keep the settings similar to his previous CPAP machine.  We will send the order to his current DME supplier.  He will need a follow-up appointment within 31 to 89 days of set up.  He can schedule a follow-up with me or nurse practitioner.

## 2023-07-10 NOTE — Telephone Encounter (Signed)
I called pt. I advised pt that Dr. Frances Furbish reviewed their sleep study results and found that pt moderate OSA. Dr. Frances Furbish recommends that pt continues CPAP. I reviewed PAP compliance expectations with the pt. Pt is agreeable to starting a CPAP. I advised pt that an order will be sent to a DME, [facility], and [facility] will call the pt within about one week after they file with the pt's insurance. [facility] will show the pt how to use the machine, fit for masks, and troubleshoot the CPAP if needed. A follow up appt was made for insurance purposes with Amy 11/26 130pm Pt verbalized understanding to arrive 15 minutes early and bring their CPAP. Pt verbalized understanding of results. Pt had no questions at this time but was encouraged to call back if questions arise. I have sent the order to [facility] and have received confirmation that they have received the order.

## 2023-07-10 NOTE — Addendum Note (Signed)
Addended by: [name] on: 07/10/2023 01:22 PM   Modules accepted: Orders

## 2023-07-10 NOTE — Telephone Encounter (Signed)
Pt is asking to be called re:his CPAP and supplies

## 2023-07-10 NOTE — Procedures (Signed)
[facility]  HOME SLEEP TEST (Watch PAT) REPORT - Mail-out Device  STUDY DATE: 06/25/2023  DOB: 02/19/1966  MRN: [id number]  ORDERING CLINICIAN: [name], MD, PhD   REFERRING CLINICIAN: Alysia Penna, MD   CLINICAL INFORMATION/HISTORY: 57 year old male with an underlying medical history of diabetes, neuropathy, history of retinal hemorrhage, history of kidney stones, hypertension, hyperlipidemia, osteoarthritis, and history of osteomyelitis, and obesity, who was previously diagnosed with obstructive sleep apnea and placed on PAP therapy.  He has an older machine and should be eligible for new equipment.  He has been on CPAP of 18 cm with EPR of 3.  Epworth sleepiness score: 3/24.  BMI: 38 kg/m  FINDINGS:   Sleep Summary:   Total Recording Time (hours, min): 7 hours, 26 min  Total Sleep Time (hours, min):  6 hours, 25 min  Percent REM (%):    28.8%   Respiratory Indices:   Calculated pAHI (per hour):  20/hour         REM pAHI:    19.9/hour       NREM pAHI: 20/hour  Central pAHI: 0.5/hour  Oxygen Saturation Statistics:    Oxygen Saturation (%) Mean: 92%   Minimum oxygen saturation (%):                 71%   O2 Saturation Range (%): 71-99%    O2 Saturation (minutes) <=88%: 18.8 min  Pulse Rate Statistics:   Pulse Mean (bpm):    85/min    Pulse Range (48-111/min)   IMPRESSION: OSA (obstructive sleep apnea), moderate  Nocturnal Hypoxemia  RECOMMENDATION:  This home sleep test demonstrates moderate obstructive sleep apnea with a total AHI of 20/hour and O2 nadir of 71% with significant time below or at 88% saturation of over 18 minutes, indicating nocturnal hypoxemia.  Snoring was in the mild to moderate range.  Ongoing treatment with a positive airway pressure (PAP) device is recommended. The patient has been on CPAP of 18 cm with EPR of 3.  He should be eligible for new machine.  I have recommended AutoPap therapy with a pressure  range of 16 to 19 cm with EPR of 3.  I will write for a new machine. A full night titration study may be considered to optimize treatment settings, monitor proper oxygen saturations and aid with improvement of tolerance and adherence, if needed down the road.  Alternative treatment options may include a dental device through dentistry or orthodontics in selected patients or Inspire (hypoglossal nerve stimulator) in carefully selected patients (meeting inclusion criteria).  Concomitant weight loss is highly recommended (where clinically appropriate). Please note that untreated obstructive sleep apnea may carry additional perioperative morbidity. Patients with significant obstructive sleep apnea should receive perioperative PAP therapy and the surgeons and particularly the anesthesiologist should be informed of the diagnosis and the severity of the sleep disordered breathing. The patient should be cautioned not to drive, work at heights, or operate dangerous or heavy equipment when tired or sleepy. Review and reiteration of good sleep hygiene measures should be pursued with any patient. Other causes of the patient's symptoms, including circadian rhythm disturbances, an underlying mood disorder, medication effect and/or an underlying medical problem cannot be ruled out based on this test. Clinical correlation is recommended.  The patient and his referring provider will be notified of the test results. The patient will be seen in follow up in sleep clinic at [facility].  I certify that I have reviewed the raw data recording prior to  the issuance of this report in accordance with the standards of the American Academy of Sleep Medicine (AASM).    INTERPRETING PHYSICIAN:   [name], MD, PhD Medical Director, [facility]) Diplomat, ABPN (Neurology and Sleep)   [facility] [address], [address] [place], [place] [address] [phone]

## 2023-07-10 NOTE — Telephone Encounter (Addendum)
Joseph Hinton, CMA  Joseph Hinton Joseph Hinton; Joseph Hinton; Joseph Hinton, Joseph Hinton; Joseph Hinton New orders have been placed for the above pt, DOB: 2066-04-16. Pt did mention to me that he uses DME [facility]) Thanks  New, Joseph Hinton, CMA; [place], Joseph Hinton; [name], Joseph Hinton; 1 other Received, thank you!

## 2023-07-25 DIAGNOSIS — G4733 Obstructive sleep apnea (adult) (pediatric): Secondary | ICD-10-CM | POA: Diagnosis not present

## 2023-08-07 DIAGNOSIS — G4733 Obstructive sleep apnea (adult) (pediatric): Secondary | ICD-10-CM | POA: Diagnosis not present

## 2023-08-14 DIAGNOSIS — I83009 Varicose veins of unspecified lower extremity with ulcer of unspecified site: Secondary | ICD-10-CM | POA: Diagnosis not present

## 2023-08-14 DIAGNOSIS — I83893 Varicose veins of bilateral lower extremities with other complications: Secondary | ICD-10-CM | POA: Diagnosis not present

## 2023-08-14 DIAGNOSIS — E119 Type 2 diabetes mellitus without complications: Secondary | ICD-10-CM | POA: Diagnosis not present

## 2023-08-14 DIAGNOSIS — I83812 Varicose veins of left lower extremities with pain: Secondary | ICD-10-CM | POA: Diagnosis not present

## 2023-08-14 DIAGNOSIS — L97909 Non-pressure chronic ulcer of unspecified part of unspecified lower leg with unspecified severity: Secondary | ICD-10-CM | POA: Diagnosis not present

## 2023-08-15 ENCOUNTER — Encounter: Payer: Self-pay | Admitting: Neurology

## 2023-08-18 DIAGNOSIS — D839 Common variable immunodeficiency, unspecified: Secondary | ICD-10-CM | POA: Diagnosis not present

## 2023-08-18 DIAGNOSIS — I83813 Varicose veins of bilateral lower extremities with pain: Secondary | ICD-10-CM | POA: Diagnosis not present

## 2023-08-19 DIAGNOSIS — H25013 Cortical age-related cataract, bilateral: Secondary | ICD-10-CM | POA: Diagnosis not present

## 2023-08-19 DIAGNOSIS — I83812 Varicose veins of left lower extremities with pain: Secondary | ICD-10-CM | POA: Diagnosis not present

## 2023-08-19 DIAGNOSIS — I10 Essential (primary) hypertension: Secondary | ICD-10-CM | POA: Diagnosis not present

## 2023-08-19 DIAGNOSIS — I83893 Varicose veins of bilateral lower extremities with other complications: Secondary | ICD-10-CM | POA: Diagnosis not present

## 2023-08-19 DIAGNOSIS — L97909 Non-pressure chronic ulcer of unspecified part of unspecified lower leg with unspecified severity: Secondary | ICD-10-CM | POA: Diagnosis not present

## 2023-08-19 DIAGNOSIS — I83009 Varicose veins of unspecified lower extremity with ulcer of unspecified site: Secondary | ICD-10-CM | POA: Diagnosis not present

## 2023-08-19 DIAGNOSIS — E119 Type 2 diabetes mellitus without complications: Secondary | ICD-10-CM | POA: Diagnosis not present

## 2023-08-19 DIAGNOSIS — H35033 Hypertensive retinopathy, bilateral: Secondary | ICD-10-CM | POA: Diagnosis not present

## 2023-08-24 DIAGNOSIS — G4733 Obstructive sleep apnea (adult) (pediatric): Secondary | ICD-10-CM | POA: Diagnosis not present

## 2023-09-24 DIAGNOSIS — G4733 Obstructive sleep apnea (adult) (pediatric): Secondary | ICD-10-CM | POA: Diagnosis not present

## 2023-09-25 NOTE — Progress Notes (Deleted)
PATIENT: Joseph Hinton DOB: 04-16-1966  REASON FOR VISIT: follow up HISTORY FROM: patient  No chief complaint on file.    HISTORY OF PRESENT ILLNESS:  09/25/23 ALL:  Joseph Hinton is a 57 y.o. male here today for follow up for OSA on CPAP.  He was last seen by Joseph Hinton 06/2023 and needing new CPAP machine. HST 06/2023 showed moderate obstructive sleep apnea with a total AHI of 20/hour and O2 nadir of 71% with significant time below or at 88% saturation of over 18 minutes, indicating nocturnal hypoxemia. New AutoPAP ordered. Since,     HISTORY: (copied from Joseph Hinton previous note)  Dear Joseph. Link Hinton,   I saw your patient, Joseph Hinton, upon your kind request in my sleep clinic today for evaluation of his sleep disorder, in particular, his prior diagnosis of obstructive sleep apnea.  The patient is accompanied by his wife today.  As you know, Joseph Hinton is a 57 year old male with an underlying medical history of diabetes, neuropathy, history of retinal hemorrhage, history of kidney stones, hypertension, hyperlipidemia, osteoarthritis, and history of osteomyelitis, and obesity, who was previously diagnosed with obstructive sleep apnea and placed on PAP therapy.  His Epworth sleepiness score is 3 out of 24, fatigue severity score is 63 out of 63.  He reports using his CPAP every night.  He reports that it has been turning off randomly at night and he has to turn it back on.  In the past 30 days there is very limited usage on his download, an average of only 7 minutes of usage is detected in the past 30 days and over the past 90 days he had limited usage after early June 2024.  He reports that he has been compliant with treatment.  He feels that he cannot sleep without his CPAP machine, he has a ResMed air sense 10 CPAP machine with a set pressure of 18 cm with EPR of 3.  His DME provider is [facility].  He is working on weight loss.  Bedtime is around midnight and rise time around 8 or 9.  He  denies nightly nocturia but reports morning headaches.  He takes prescription ibuprofen for chronic pain particularly back pain.  He drinks caffeine in the form of soda and tea, about 3 to 4 glasses/day, currently no alcohol, quit smoking in 2000.  He lives with his wife, they may have in [place].   I reviewed your office note from 12/03/22.    I had evaluated him several years ago for sleep apnea.  He has been on PAP therapy for years.  He has recently noticed an error message on his CPAP machine indicating that the motor life has been exceeded.  He has not had a sleep study in many years. He did not follow-up in our sleep clinic.   REVIEW OF SYSTEMS: Out of a complete 14 system review of symptoms, the patient complains only of the following symptoms, and all other reviewed systems are negative.  ESS:  ALLERGIES: No Known Allergies  HOME MEDICATIONS: Outpatient Medications Prior to Visit  Medication Sig Dispense Refill   ACCU-CHEK AVIVA PLUS test strip USE AS DIRECTED TO CHECK BLOOD SUGARS TWICE DAILY 100 strip 2   aspirin 81 MG chewable tablet Chew 1 tablet (81 mg total) by mouth 2 (two) times daily. 30 tablet 0   atorvastatin (LIPITOR) 10 MG tablet Take 10 mg by mouth daily.     cyclobenzaprine (FLEXERIL) 10 MG tablet Take 10 mg  by mouth 3 (three) times daily as needed for muscle spasms.     enalapril (VASOTEC) 10 MG tablet Take 10 mg by mouth daily.     gabapentin (NEURONTIN) 600 MG tablet Take 600 mg by mouth 3 (three) times daily.     ibuprofen (ADVIL) 600 MG tablet Take 600 mg by mouth 3 (three) times daily.     JARDIANCE 10 MG TABS tablet Take 10 mg by mouth daily.     metFORMIN (GLUCOPHAGE) 1000 MG tablet Take 1,000 mg by mouth 2 (two) times daily.     methocarbamol (ROBAXIN) 500 MG tablet Take 1 tablet (500 mg total) by mouth every 6 (six) hours as needed for muscle spasms. 60 tablet 0   oxyCODONE (OXY IR/ROXICODONE) 5 MG immediate release tablet Take 1-2 tablets (5-10 mg total)  by mouth every 4 (four) hours as needed for moderate pain (pain score 4-6). 30 tablet 0   OZEMPIC, 1 MG/DOSE, 2 MG/1.5ML SOPN Inject 1 mg into the skin every Wednesday.     No facility-administered medications prior to visit.    PAST MEDICAL HISTORY: Past Medical History:  Diagnosis Date   Diabetes mellitus without complication (HCC)    Foot infection    History of kidney stones    Hyperlipidemia    Hypertension    Neuropathy    OA (osteoarthritis of spine)    OSA on CPAP    PVC's (premature ventricular contractions)    Retinal hemorrhage, right     PAST SURGICAL HISTORY: Past Surgical History:  Procedure Laterality Date   TONSILLECTOMY AND ADENOIDECTOMY     TOTAL HIP ARTHROPLASTY Left 09/10/2019   Procedure: LEFT TOTAL HIP ARTHROPLASTY ANTERIOR APPROACH;  Surgeon: Joseph Hitch, MD;  Location: [facility];  Service: Orthopedics;  Laterality: Left;   uvulaectomy      FAMILY HISTORY: Family History  Problem Relation Age of Onset   Stroke Mother    Diabetes Mother    Hypertension Mother    Lung cancer Father    Nephrolithiasis Brother    Sleep apnea Neg Hx     SOCIAL HISTORY: Social History   Socioeconomic History   Marital status: Married    Spouse name: Not on file   Number of children: Not on file   Years of education: Not on file   Highest education level: Not on file  Occupational History   Not on file  Tobacco Use   Smoking status: Former   Smokeless tobacco: Never  Vaping Use   Vaping status: Never Used  Substance and Sexual Activity   Alcohol use: No    Comment: quit in 1997   Drug use: No   Sexual activity: Not on file  Other Topics Concern   Not on file  Social History Narrative   Not on file   Social Determinants of Health   Financial Resource Strain: Not on file  Food Insecurity: Not on file  Transportation Needs: Not on file  Physical Activity: Not on file  Stress: Not on file  Social Connections: Not on file  Intimate Partner  Violence: Not on file     PHYSICAL EXAM  There were no vitals filed for this visit. There is no height or weight on file to calculate BMI.  Generalized: Well developed, in no acute distress  Cardiology: normal rate and rhythm, no murmur noted Respiratory: clear to auscultation bilaterally  Neurological examination  Mentation: Alert oriented to time, place, history taking. Follows all commands speech and language fluent  Cranial nerve II-XII: Pupils were equal round reactive to light. Extraocular movements were full, visual field were full  Motor: The motor testing reveals 5 over 5 strength of all 4 extremities. Good symmetric motor tone is noted throughout.  Gait and station: Gait is normal.    DIAGNOSTIC DATA (LABS, IMAGING, TESTING) - I reviewed patient records, labs, notes, testing and imaging myself where available.      No data to display           Lab Results  Component Value Date   WBC 10.7 (H) 09/12/2019   HGB 12.5 (L) 09/12/2019   HCT 35.6 (L) 09/12/2019   MCV 88.1 09/12/2019   PLT 147 (L) 09/12/2019      Component Value Date/Time   NA 134 (L) 09/11/2019 0256   K 4.0 09/11/2019 0256   CL 97 (L) 09/11/2019 0256   CO2 22 09/11/2019 0256   GLUCOSE 176 (H) 09/11/2019 0256   BUN 16 09/11/2019 0256   CREATININE 0.55 (L) 09/11/2019 0256   CALCIUM 8.6 (L) 09/11/2019 0256   PROT 7.1 04/28/2008 2040   ALBUMIN 4.4 04/28/2008 2040   AST 32 04/28/2008 2040   ALT 36 03/13/2009 2039   ALKPHOS 98 04/28/2008 2040   BILITOT 1.6 (H) 04/28/2008 2040   GFRNONAA >60 09/11/2019 0256   GFRAA >60 09/11/2019 0256   Lab Results  Component Value Date   CHOL 168 03/13/2009   HDL 32 (L) 03/13/2009   LDLCALC See Comment mg/dL 16/08/9603   TRIG 540 (H) 03/13/2009   CHOLHDL 5.3 Ratio 03/13/2009   Lab Results  Component Value Date   HGBA1C 6.7 (H) 09/07/2019   No results found for: "VITAMINB12" No results found for: "TSH"   ASSESSMENT AND PLAN 57 y.o. year old male  has  a past medical history of Diabetes mellitus without complication (HCC), Foot infection, History of kidney stones, Hyperlipidemia, Hypertension, Neuropathy, OA (osteoarthritis of spine), OSA on CPAP, PVC's (premature ventricular contractions), and Retinal hemorrhage, right. here with   No diagnosis found.    Joseph Hinton is doing well on CPAP therapy. Compliance report reveals ***. *** was encouraged to continue using CPAP nightly and for greater than 4 hours each night. We will update supply orders as indicated. Risks of untreated sleep apnea review and education materials provided. Healthy lifestyle habits encouraged. *** will follow up in ***, sooner if needed. *** verbalizes understanding and agreement with this plan.    No orders of the defined types were placed in this encounter.    No orders of the defined types were placed in this encounter.     Joseph Dapper, FNP-C 09/25/2023, 4:41 PM [facility] [address] [place], [place] [address] [phone]

## 2023-09-29 ENCOUNTER — Encounter: Payer: Medicare PPO | Admitting: Family Medicine

## 2023-09-30 ENCOUNTER — Encounter: Payer: Medicare PPO | Admitting: Family Medicine

## 2023-09-30 DIAGNOSIS — G4733 Obstructive sleep apnea (adult) (pediatric): Secondary | ICD-10-CM

## 2023-10-06 ENCOUNTER — Telehealth: Payer: Self-pay | Admitting: Neurology

## 2023-10-06 NOTE — Telephone Encounter (Signed)
Called and informed pt as well to log on to his mychart for the visit. Explained to pt the steps he would need to take in order for him to be able to connect with the provider on his appt date. Pt verbalized understanding.

## 2023-10-06 NOTE — Telephone Encounter (Signed)
Pt called wanting to know if this appt can be changed to a VV. Please advise.

## 2023-10-06 NOTE — Telephone Encounter (Signed)
approved

## 2023-10-06 NOTE — Telephone Encounter (Signed)
Thank you :)

## 2023-10-06 NOTE — Telephone Encounter (Signed)
Pt called to reschedule Initial CPAP appointmenr. Informed pt must reschedule between 30-90 days from the time you started using your CPAP for insurance to cover the cost. Pt could not remember the date, but ask to leave appointment as is and will call  back to reschedule after finding out the start date.

## 2023-10-08 NOTE — Progress Notes (Unsigned)
PATIENT: Joseph Hinton DOB: 1966/07/07  REASON FOR VISIT: follow up HISTORY FROM: patient  Virtual Visit via Telephone Note  I connected with Cecile Sheerer on 10/09/23 at  8:30 AM EST by telephone and verified that I am speaking with the correct person using two identifiers.   I discussed the limitations, risks, security and privacy concerns of performing an evaluation and management service by telephone and the availability of in person appointments. I also discussed with the patient that there may be a patient responsible charge related to this service. The patient expressed understanding and agreed to proceed.   History of Present Illness:  10/09/23 ALL (Mychart): Joseph Hinton is a 57 y.o. male here today for follow up for OSA on CPAP.  He was last seen by Dr Frances Furbish 06/2023 and needing new CPAP machine. HST 06/2023 showed moderate obstructive sleep apnea with a total AHI of 20/hour and O2 nadir of 71% with significant time below or at 88% saturation of over 18 minutes, indicating nocturnal hypoxemia. New AutoPAP ordered. Since, he reports doing well. He is using therapy nightly for about 9 hours. He denies concerns with machine or supplies. He does note a leak but not bothered by this. He feels it is due to nasal pillow mask shifting when he turns at night. AHI well managed.     HISTORY: (copied from Dr Teofilo Pod previous note)  Dear Dr. Link Snuffer,   I saw your patient, Joseph Hinton, upon your kind request in my sleep clinic today for evaluation of his sleep disorder, in particular, his prior diagnosis of obstructive sleep apnea.  The patient is accompanied by his wife today.  As you know, Mr. Honkomp is a 57 year old male with an underlying medical history of diabetes, neuropathy, history of retinal hemorrhage, history of kidney stones, hypertension, hyperlipidemia, osteoarthritis, and history of osteomyelitis, and obesity, who was previously diagnosed with obstructive sleep apnea and placed  on PAP therapy.  His Epworth sleepiness score is 3 out of 24, fatigue severity score is 63 out of 63.  He reports using his CPAP every night.  He reports that it has been turning off randomly at night and he has to turn it back on.  In the past 30 days there is very limited usage on his download, an average of only 7 minutes of usage is detected in the past 30 days and over the past 90 days he had limited usage after early June 2024.  He reports that he has been compliant with treatment.  He feels that he cannot sleep without his CPAP machine, he has a ResMed air sense 10 CPAP machine with a set pressure of 18 cm with EPR of 3.  His DME provider is [facility].  He is working on weight loss.  Bedtime is around midnight and rise time around 8 or 9.  He denies nightly nocturia but reports morning headaches.  He takes prescription ibuprofen for chronic pain particularly back pain.  He drinks caffeine in the form of soda and tea, about 3 to 4 glasses/day, currently no alcohol, quit smoking in 2000.  He lives with his wife, they may have in [place].   I reviewed your office note from 12/03/22.    I had evaluated him several years ago for sleep apnea.  He has been on PAP therapy for years.  He has recently noticed an error message on his CPAP machine indicating that the motor life has been exceeded.  He has not had a sleep  study in many years. He did not follow-up in our sleep clinic.   Observations/Objective:  Generalized: Well developed, in no acute distress  Mentation: Alert oriented to time, place, history taking. Follows all commands speech and language fluent   Assessment and Plan:  57 y.o. year old male  has a past medical history of Diabetes mellitus without complication (HCC), Foot infection, History of kidney stones, Hyperlipidemia, Hypertension, Neuropathy, OA (osteoarthritis of spine), OSA on CPAP, PVC's (premature ventricular contractions), and Retinal hemorrhage, right. here with     ICD-10-CM   1. OSA on CPAP  G47.33       [name] is doing well with therapy. Compliance report reveals excellent compliance. He does have a significant leak. Using nasal pillow. He is not bothered by leak and AHI well managed. I have encouraged him to monitor for leak at home. He was encouraged to use CPAP nightly for at least 4 hours. He will follow up in 1 year, sooner if needed.   No orders of the defined types were placed in this encounter.   No orders of the defined types were placed in this encounter.    Follow Up Instructions:  I discussed the assessment and treatment plan with the patient. The patient was provided an opportunity to ask questions and all were answered. The patient agreed with the plan and demonstrated an understanding of the instructions.   The patient was advised to call back or seek an in-person evaluation if the symptoms worsen or if the condition fails to improve as anticipated.  I provided 15 minutes of non-face-to-face time during this encounter. Patient located at their place of residence during Mychart visit. Provider is in the office.    Shawnie Dapper, NP

## 2023-10-08 NOTE — Patient Instructions (Signed)
Please continue using your CPAP regularly. While your insurance requires that you use CPAP at least 4 hours each night on 70% of the nights, I recommend, that you not skip any nights and use it throughout the night if you can. Getting used to CPAP and staying with the treatment long term does take time and patience and discipline. Untreated obstructive sleep apnea when it is moderate to severe can have an adverse impact on cardiovascular health and raise her risk for heart disease, arrhythmias, hypertension, congestive heart failure, stroke and diabetes. Untreated obstructive sleep apnea causes sleep disruption, nonrestorative sleep, and sleep deprivation. This can have an impact on your day to day functioning and cause daytime sleepiness and impairment of cognitive function, memory loss, mood disturbance, and problems focussing. Using CPAP regularly can improve these symptoms.  We will update supply orders, today. Keep an eye on the leak at home. Try to make sure mask is well fitting. Contact DME if wish to try a different mask.   Follow up in 1 year

## 2023-10-09 ENCOUNTER — Encounter: Payer: Self-pay | Admitting: Family Medicine

## 2023-10-09 ENCOUNTER — Telehealth (INDEPENDENT_AMBULATORY_CARE_PROVIDER_SITE_OTHER): Payer: Medicare PPO | Admitting: Family Medicine

## 2023-10-09 DIAGNOSIS — G4733 Obstructive sleep apnea (adult) (pediatric): Secondary | ICD-10-CM | POA: Diagnosis not present

## 2023-10-14 DIAGNOSIS — I83022 Varicose veins of left lower extremity with ulcer of calf: Secondary | ICD-10-CM | POA: Diagnosis not present

## 2023-10-20 DIAGNOSIS — I83009 Varicose veins of unspecified lower extremity with ulcer of unspecified site: Secondary | ICD-10-CM | POA: Diagnosis not present

## 2023-10-20 DIAGNOSIS — I83812 Varicose veins of left lower extremities with pain: Secondary | ICD-10-CM | POA: Diagnosis not present

## 2023-10-20 DIAGNOSIS — E119 Type 2 diabetes mellitus without complications: Secondary | ICD-10-CM | POA: Diagnosis not present

## 2023-10-20 DIAGNOSIS — L97911 Non-pressure chronic ulcer of unspecified part of right lower leg limited to breakdown of skin: Secondary | ICD-10-CM | POA: Diagnosis not present

## 2023-10-20 DIAGNOSIS — I83893 Varicose veins of bilateral lower extremities with other complications: Secondary | ICD-10-CM | POA: Diagnosis not present

## 2023-10-24 DIAGNOSIS — G4733 Obstructive sleep apnea (adult) (pediatric): Secondary | ICD-10-CM | POA: Diagnosis not present

## 2024-08-03 ENCOUNTER — Encounter: Payer: Self-pay | Admitting: *Deleted

## 2024-08-03 NOTE — Telephone Encounter (Signed)
 Pt called phone room to schedule at 2:04 pm.

## 2024-11-15 NOTE — Progress Notes (Unsigned)
 "  PATIENT: Joseph Hinton DOB: [birth year]  REASON FOR VISIT: follow up HISTORY FROM: patient  Virtual Visit via Telephone Note  I connected with Joseph Hinton on 11/16/2024 at  8:15 AM EST by telephone and verified that I am speaking with the correct person using two identifiers.   I discussed the limitations, risks, security and privacy concerns of performing an evaluation and management service by telephone and the availability of in person appointments. I also discussed with the patient that there may be a patient responsible charge related to this service. The patient expressed understanding and agreed to proceed.   History of Present Illness:  11/16/2024 ALL (Mychart): Joseph Hinton returns for follow up for OSA on CPAP. He continues to do well. He is using therapy nightly for about 9 hours, on average. He reports sleeping well. He does reports having some difficulty with his nasal pillow. He reports difficulty keeping a seal. He has not reached out to [facility]. He denies concerns with the machine. He does note better sleep quality and improved energy.     10/09/2023 ALL (Mychart): Joseph Hinton is a 59 y.o. male here today for follow up for OSA on CPAP.  He was last seen by Dr Buck 06/2023 and needing new CPAP machine. HST 06/2023 showed moderate obstructive sleep apnea with a total AHI of 20/hour and O2 nadir of 71% with significant time below or at 88% saturation of over 18 minutes, indicating nocturnal hypoxemia. New AutoPAP ordered. Since, he reports doing well. He is using therapy nightly for about 9 hours. He denies concerns with machine or supplies. He does note a leak but not bothered by this. He feels it is due to nasal pillow mask shifting when he turns at night. AHI well managed.     HISTORY: (copied from Dr Obie previous note)  Dear Dr. Larnell,   I saw your patient, Joseph Hinton, upon your kind request in my sleep clinic today for evaluation of his sleep disorder, in particular,  his prior diagnosis of obstructive sleep apnea.  The patient is accompanied by his wife today.  As you know, Joseph Hinton is a 59 year old male with an underlying medical history of diabetes, neuropathy, history of retinal hemorrhage, history of kidney stones, hypertension, hyperlipidemia, osteoarthritis, and history of osteomyelitis, and obesity, who was previously diagnosed with obstructive sleep apnea and placed on PAP therapy.  His Epworth sleepiness score is 3 out of 24, fatigue severity score is 63 out of 63.  He reports using his CPAP every night.  He reports that it has been turning off randomly at night and he has to turn it back on.  In the past 30 days there is very limited usage on his download, an average of only 7 minutes of usage is detected in the past 30 days and over the past 90 days he had limited usage after early June 2024.  He reports that he has been compliant with treatment.  He feels that he cannot sleep without his CPAP machine, he has a ResMed air sense 10 CPAP machine with a set pressure of 18 cm with EPR of 3.  His DME provider is [facility].  He is working on weight loss.  Bedtime is around midnight and rise time around 8 or 9.  He denies nightly nocturia but reports morning headaches.  He takes prescription ibuprofen for chronic pain particularly back pain.  He drinks caffeine in the form of soda and tea, about 3 to 4 glasses/day, currently  no alcohol , quit smoking in 2000.  He lives with his wife, they may have in [place].   I reviewed your office note from 12/03/22.    I had evaluated him several years ago for sleep apnea.  He has been on PAP therapy for years.  He has recently noticed an error message on his CPAP machine indicating that the motor life has been exceeded.  He has not had a sleep study in many years. He did not follow-up in our sleep clinic.   Observations/Objective:  Generalized: Well developed, in no acute distress  Mentation: Alert oriented to time,  place, history taking. Follows all commands speech and language fluent   Assessment and Plan:  59 y.o. year old male  has a past medical history of Diabetes mellitus without complication (HCC), Foot infection, History of kidney stones, Hyperlipidemia, Hypertension, Neuropathy, OA (osteoarthritis of spine), OSA on CPAP, PVC's (premature ventricular contractions), and Retinal hemorrhage, right. here with    ICD-10-CM   1. OSA on CPAP  G47.33 For home use only DME continuous positive airway pressure (CPAP)      Joseph Hinton is doing well with therapy. Compliance report reveals excellent compliance. He does have a significant leak. Using nasal pillow. He will discuss different size mask with DME. AHI well managed. I have encouraged him to monitor for leak at home. He was encouraged to use CPAP nightly for at least 4 hours. He will follow up in 1 year, sooner if needed.    Orders Placed This Encounter  Procedures   For home use only DME continuous positive airway pressure (CPAP)    Heated Humidity with all supplies as needed    Length of Need:   Lifetime    Patient has OSA or probable OSA:   Yes    Is the patient currently using CPAP in the home:   Yes    Settings:   Other see comments    CPAP supplies needed:   Mask, headgear, cushions, filters, heated tubing and water  chamber    No orders of the defined types were placed in this encounter.    Follow Up Instructions:  I discussed the assessment and treatment plan with the patient. The patient was provided an opportunity to ask questions and all were answered. The patient agreed with the plan and demonstrated an understanding of the instructions.   The patient was advised to call back or seek an in-person evaluation if the symptoms worsen or if the condition fails to improve as anticipated.  I provided 15 minutes of non-face-to-face time during this encounter. Patient located at their place of residence during Mychart visit. Provider is in  the office.    Falisa Lamora, NP  "

## 2024-11-15 NOTE — Patient Instructions (Incomplete)
 Please continue using your CPAP regularly. While your insurance requires that you use CPAP at least 4 hours each night on 70% of the nights, I recommend, that you not skip any nights and use it throughout the night if you can. Getting used to CPAP and staying with the treatment long term does take time and patience and discipline. Untreated obstructive sleep apnea when it is moderate to severe can have an adverse impact on cardiovascular health and raise her risk for heart disease, arrhythmias, hypertension, congestive heart failure, stroke and diabetes. Untreated obstructive sleep apnea causes sleep disruption, nonrestorative sleep, and sleep deprivation. This can have an impact on your day to day functioning and cause daytime sleepiness and impairment of cognitive function, memory loss, mood disturbance, and problems focussing. Using CPAP regularly can improve these symptoms.  We will update supply orders, today. Talk with [facility] about mask fit. You may what to try a different size,   Follow up in 1 year

## 2024-11-15 NOTE — Progress Notes (Unsigned)
 [name]

## 2024-11-16 ENCOUNTER — Encounter: Payer: Self-pay | Admitting: Family Medicine

## 2024-11-16 ENCOUNTER — Telehealth: Admitting: Family Medicine

## 2024-11-16 DIAGNOSIS — G4733 Obstructive sleep apnea (adult) (pediatric): Secondary | ICD-10-CM

## 2024-11-17 NOTE — Progress Notes (Signed)
 [name]

## 2025-11-22 ENCOUNTER — Telehealth: Admitting: Family Medicine
